# Patient Record
Sex: Male | Born: 1980 | Race: Black or African American | Hispanic: No | Marital: Single | Smoking: Never smoker
Health system: Southern US, Community
[De-identification: ages and names within clinical notes are randomized; demographics above are authoritative.]

## PROBLEM LIST (undated history)

## (undated) DIAGNOSIS — R310 Gross hematuria: Secondary | ICD-10-CM

## (undated) DIAGNOSIS — B20 Human immunodeficiency virus [HIV] disease: Secondary | ICD-10-CM

---

## 2013-12-30 ENCOUNTER — Other Ambulatory Visit: Payer: Self-pay | Admitting: Infectious Disease

## 2013-12-30 ENCOUNTER — Ambulatory Visit
Admission: RE | Admit: 2013-12-30 | Discharge: 2013-12-30 | Disposition: A | Discharge status: Home / Self Care | Payer: No Typology Code available for payment source | Source: Ambulatory Visit | Attending: Infectious Disease | Admitting: Infectious Disease

## 2013-12-30 DIAGNOSIS — A159 Respiratory tuberculosis unspecified: Secondary | ICD-10-CM

## 2014-01-11 ENCOUNTER — Encounter (HOSPITAL_COMMUNITY): Payer: Self-pay | Admitting: Emergency Medicine

## 2014-01-11 ENCOUNTER — Emergency Department (INDEPENDENT_AMBULATORY_CARE_PROVIDER_SITE_OTHER)
Admission: EM | Admit: 2014-01-11 | Discharge: 2014-01-11 | Disposition: A | Discharge status: Home / Self Care | Payer: Medicaid Other | Source: Home / Self Care | Attending: Family Medicine | Admitting: Family Medicine

## 2014-01-11 DIAGNOSIS — Z21 Asymptomatic human immunodeficiency virus [HIV] infection status: Secondary | ICD-10-CM

## 2014-01-11 DIAGNOSIS — R300 Dysuria: Secondary | ICD-10-CM

## 2014-01-11 DIAGNOSIS — R319 Hematuria, unspecified: Secondary | ICD-10-CM

## 2014-01-11 DIAGNOSIS — B20 Human immunodeficiency virus [HIV] disease: Secondary | ICD-10-CM

## 2014-01-11 HISTORY — DX: Human immunodeficiency virus (HIV) disease: B20

## 2014-01-11 LAB — CBC WITH DIFFERENTIAL/PLATELET
Basophils Absolute: 0 10*3/uL (ref 0.0–0.1)
Basophils Relative: 1 % (ref 0–1)
Eosinophils Absolute: 0.3 10*3/uL (ref 0.0–0.7)
Eosinophils Relative: 6 % — ABNORMAL HIGH (ref 0–5)
HCT: 42.8 % (ref 39.0–52.0)
Hemoglobin: 14.7 g/dL (ref 13.0–17.0)
LYMPHS ABS: 1.9 10*3/uL (ref 0.7–4.0)
LYMPHS PCT: 42 % (ref 12–46)
MCH: 30.5 pg (ref 26.0–34.0)
MCHC: 34.3 g/dL (ref 30.0–36.0)
MCV: 88.8 fL (ref 78.0–100.0)
Monocytes Absolute: 0.3 10*3/uL (ref 0.1–1.0)
Monocytes Relative: 7 % (ref 3–12)
NEUTROS ABS: 2 10*3/uL (ref 1.7–7.7)
NEUTROS PCT: 44 % (ref 43–77)
PLATELETS: 166 10*3/uL (ref 150–400)
RBC: 4.82 MIL/uL (ref 4.22–5.81)
RDW: 11.8 % (ref 11.5–15.5)
WBC: 4.5 10*3/uL (ref 4.0–10.5)

## 2014-01-11 LAB — URINALYSIS, ROUTINE W REFLEX MICROSCOPIC
BILIRUBIN URINE: NEGATIVE
GLUCOSE, UA: NEGATIVE mg/dL
KETONES UR: NEGATIVE mg/dL
Leukocytes, UA: NEGATIVE
Nitrite: NEGATIVE
PROTEIN: NEGATIVE mg/dL
Specific Gravity, Urine: 1.01 (ref 1.005–1.030)
Urobilinogen, UA: 0.2 mg/dL (ref 0.0–1.0)
pH: 7.5 (ref 5.0–8.0)

## 2014-01-11 LAB — URINE MICROSCOPIC-ADD ON

## 2014-01-11 LAB — POCT URINALYSIS DIP (DEVICE)
Bilirubin Urine: NEGATIVE
Glucose, UA: NEGATIVE mg/dL
KETONES UR: NEGATIVE mg/dL
Leukocytes, UA: NEGATIVE
Nitrite: NEGATIVE
PH: 7.5 (ref 5.0–8.0)
PROTEIN: NEGATIVE mg/dL
SPECIFIC GRAVITY, URINE: 1.015 (ref 1.005–1.030)
UROBILINOGEN UA: 0.2 mg/dL (ref 0.0–1.0)

## 2014-01-11 MED ORDER — PHENAZOPYRIDINE HCL 100 MG PO TABS: 100.0000 mg | ORAL_TABLET | Freq: Three times a day (TID) | ORAL | Status: DC | PRN

## 2014-01-11 NOTE — ED Notes (Signed)
Pt was diagnosed with HIV 1.5 years ago in [place].  Pt has not had treatment.  He has had blood in his urine for over a year.  He complains of urgency and frequency of urination.  They would like to get a referral to a Urologist.

## 2014-01-11 NOTE — Discharge Instructions (Signed)
The cause of your bleeding needs further workup. Urology will call you with an appointment Please keep your appointment with the ID doctor ion the 27th Please use the pyridium for the penile irritation Please continue to try to get an appointment at the [facility].     La cause de vos saignements besoin traitement ultrieur. Urologie vous appeler [employer] un rendez-vous S'il vous plat garder votre rendez-vous avec le mdecin de l'ID ions 27 S'il vous plat utiliser le pyridium pour l'irritation du pnis S'il vous plat continuer  essayer d'obtenir un rendez-vous au centre [facility].

## 2014-01-11 NOTE — ED Provider Notes (Signed)
CSN: [id number]     Arrival date & time 01/11/14  1347 History   First MD Initiated Contact with Patient 01/11/14 1538     Chief Complaint  Patient presents with  . Hematuria  . Urinary Frequency  . Urinary Urgency   (Consider location/radiation/quality/duration/timing/severity/associated sxs/prior Treatment) HPI  Blood in urine: present for more than 1 year. Typically at the end of urinary stream. Every time. Hurts 5/10 every time. No history of instrumentation. [place] in [place] urologist which was incomplete due to HIV status which pt states the doctor refused to treat him.   Migrated to [place] 4 wks ago.   HIV Dx 66mo ago. Unsure of how he contracted the illness. Told he could not get treatment in [place]. Denies IV drug use. Pt w/ appt set to be seen by ID clinic on December 27th.   Denies Fevers, CP, palpitations, abd pain.   TB screening is negative.    Past Medical History  Diagnosis Date  . HIV disease    History reviewed. No pertinent past surgical history. History reviewed. No pertinent family history. History  Substance Use Topics  . Smoking status: Never Smoker   . Smokeless tobacco: Never Used  . Alcohol Use: No    Review of Systems Per HPI with all other pertinent systems negative.   Allergies  Review of patient's allergies indicates no known allergies.  Home Medications   Prior to Admission medications   Medication Sig Start Date End Date Taking? Authorizing Provider  phenazopyridine (PYRIDIUM) 100 MG tablet Take 1-2 tablets (100-200 mg total) by mouth 3 (three) times daily as needed for pain. 01/11/14   Ozella Rocksavid J Tonya Wantz, MD   BP 117/73 mmHg  Pulse 74  Temp(Src) 98.3 F (36.8 C) (Oral)  Resp 14  SpO2 100% Physical Exam  Constitutional: He is oriented to person, place, and time. He appears well-developed and well-nourished. No distress.  HENT:  Head: Normocephalic and atraumatic.  Eyes: EOM are normal. Pupils are equal, round, and  reactive to light.  Neck: Normal range of motion.  Cardiovascular: Normal rate, normal heart sounds and intact distal pulses.   No murmur heard. Pulmonary/Chest: Effort normal and breath sounds normal. No respiratory distress.  Abdominal: Soft. Bowel sounds are normal. He exhibits no distension.  Musculoskeletal: He exhibits no edema or tenderness.  Neurological: He is alert and oriented to person, place, and time. No cranial nerve deficit. Coordination normal.  Skin: Skin is warm. He is not diaphoretic.  Psychiatric: He has a normal mood and affect. His behavior is normal. Judgment and thought content normal.    ED Course  Procedures (including critical care time) Labs Review Labs Reviewed  POCT URINALYSIS DIP (DEVICE) - Abnormal; Notable for the following:    Hgb urine dipstick MODERATE (*)    All other components within normal limits  URINE CULTURE  URINALYSIS, ROUTINE W REFLEX MICROSCOPIC    Imaging Review No results found.   MDM   1. HIV (human immunodeficiency virus infection)   2. Hematuria   3. Dysuria    [facility] contacted and will see pt Urine micro and cx sent Start pyridium for dysuria  HIV - appt set for 01/31/14 - CD4, CBC w/ diff, HIV quant    Ozella Rocksavid J Marvina Danner, MD 01/11/14 [phone]

## 2014-01-12 LAB — HIV 1/2 CONFIRMATION
HIV 1 ANTIBODY: POSITIVE — AB
HIV-2 Ab: NEGATIVE

## 2014-01-12 LAB — URINE CULTURE
CULTURE: NO GROWTH
Colony Count: NO GROWTH

## 2014-01-12 LAB — T-HELPER CELLS (CD4) COUNT (NOT AT ARMC)
CD4 T CELL ABS: 470 /uL (ref 400–2700)
CD4 T CELL HELPER: 25 % — AB (ref 33–55)

## 2014-01-12 LAB — HIV ANTIBODY (ROUTINE TESTING W REFLEX): HIV: REACTIVE — AB

## 2014-01-13 ENCOUNTER — Telehealth: Payer: Self-pay | Admitting: Internal Medicine

## 2014-01-13 NOTE — Telephone Encounter (Signed)
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 Gave address. Will arrange for french translator in clinic at 2pm.

## 2014-01-14 NOTE — ED Notes (Signed)
Discussed pos. results with Dr. Konrad DoloresMerrell.  He said labs were drawn for pt. to f/u with ID.  No further action needed. Vassie MoselleYork, Lorn Butcher M 01/14/2014

## 2014-01-18 ENCOUNTER — Telehealth: Payer: Self-pay

## 2014-01-18 NOTE — Telephone Encounter (Signed)
Patient has scheduled appointment for intake which [facility]   Laurell Josephsammy K Tessia Kassin, [place]

## 2014-01-18 NOTE — Telephone Encounter (Signed)
-----   Message from Judyann Munsonynthia Snider, MD sent at 01/13/2014 11:11 AM EST ----- Contact: [phone] Hi [name], This is one of ED HIV+ patients, but he is known HIV+ recently relocated from [place], french speaking. ART naive. His name is Sean Dodson (with a soft g)  His phone number is not listed correctly in epic. It is [phone]. He is planning to coming to clinic thurs afternoon at 2pm.  Can we have a french translator come tomorrow afternoon? I know it is last minute noticed. I am not sure if he understood everything I was saying in french..then again my french over the phone is not great. I think he understood that I knew he had HIV and that we could help him with getting on treatment.

## 2014-01-26 ENCOUNTER — Ambulatory Visit: Payer: Self-pay

## 2014-01-27 ENCOUNTER — Other Ambulatory Visit (HOSPITAL_COMMUNITY)
Admission: RE | Admit: 2014-01-27 | Discharge: 2014-01-27 | Disposition: A | Discharge status: Home / Self Care | Payer: Medicaid Other | Source: Ambulatory Visit | Attending: Infectious Diseases | Admitting: Infectious Diseases

## 2014-01-27 ENCOUNTER — Ambulatory Visit (INDEPENDENT_AMBULATORY_CARE_PROVIDER_SITE_OTHER): Payer: Medicaid Other

## 2014-01-27 DIAGNOSIS — Z113 Encounter for screening for infections with a predominantly sexual mode of transmission: Secondary | ICD-10-CM | POA: Diagnosis present

## 2014-01-27 DIAGNOSIS — Z21 Asymptomatic human immunodeficiency virus [HIV] infection status: Secondary | ICD-10-CM

## 2014-01-27 DIAGNOSIS — Z23 Encounter for immunization: Secondary | ICD-10-CM

## 2014-01-27 NOTE — Progress Notes (Signed)
Patient is from [place] and traveled to [place] where he lived for 9 years and became infected with IV in 2013.  [place] the [place] since 12[place]02[place]13 . [facility].  He was tested to confirm positivity on 12-24-2013. He has never received treatment for HIV. No tattoos or piercings . No medical records to request. Pneumonia and flu vaccines given at visit.   Laurell Josephsammy K Bethanny Toelle, RN

## 2014-01-28 LAB — COMPLETE METABOLIC PANEL WITH GFR
ALT: 17 U/L (ref 0–53)
AST: 21 U/L (ref 0–37)
Albumin: 3.9 g/dL (ref 3.5–5.2)
Alkaline Phosphatase: 54 U/L (ref 39–117)
BUN: 11 mg/dL (ref 6–23)
CALCIUM: 9.2 mg/dL (ref 8.4–10.5)
CHLORIDE: 101 meq/L (ref 96–112)
CO2: 31 meq/L (ref 19–32)
CREATININE: 0.72 mg/dL (ref 0.50–1.35)
Glucose, Bld: 82 mg/dL (ref 70–99)
POTASSIUM: 4.2 meq/L (ref 3.5–5.3)
Sodium: 138 mEq/L (ref 135–145)
Total Bilirubin: 0.5 mg/dL (ref 0.2–1.2)
Total Protein: 7.6 g/dL (ref 6.0–8.3)

## 2014-01-28 LAB — HEPATITIS B CORE ANTIBODY, TOTAL: Hep B Core Total Ab: NONREACTIVE

## 2014-01-28 LAB — CBC WITH DIFFERENTIAL/PLATELET
BASOS ABS: 0 10*3/uL (ref 0.0–0.1)
Basophils Relative: 1 % (ref 0–1)
Eosinophils Absolute: 0.4 10*3/uL (ref 0.0–0.7)
Eosinophils Relative: 10 % — ABNORMAL HIGH (ref 0–5)
HEMATOCRIT: 42.3 % (ref 39.0–52.0)
HEMOGLOBIN: 14.7 g/dL (ref 13.0–17.0)
LYMPHS PCT: 27 % (ref 12–46)
Lymphs Abs: 1.1 10*3/uL (ref 0.7–4.0)
MCH: 31.1 pg (ref 26.0–34.0)
MCHC: 34.8 g/dL (ref 30.0–36.0)
MCV: 89.4 fL (ref 78.0–100.0)
MPV: 11.4 fL (ref 9.4–12.4)
Monocytes Absolute: 0.3 10*3/uL (ref 0.1–1.0)
Monocytes Relative: 7 % (ref 3–12)
Neutro Abs: 2.1 10*3/uL (ref 1.7–7.7)
Neutrophils Relative %: 55 % (ref 43–77)
Platelets: 162 10*3/uL (ref 150–400)
RBC: 4.73 MIL/uL (ref 4.22–5.81)
RDW: 12.2 % (ref 11.5–15.5)
WBC: 3.9 10*3/uL — AB (ref 4.0–10.5)

## 2014-01-28 LAB — URINALYSIS
Bilirubin Urine: NEGATIVE
GLUCOSE, UA: NEGATIVE mg/dL
HGB URINE DIPSTICK: NEGATIVE
KETONES UR: NEGATIVE mg/dL
Leukocytes, UA: NEGATIVE
Nitrite: NEGATIVE
PH: 6.5 (ref 5.0–8.0)
Protein, ur: NEGATIVE mg/dL
Specific Gravity, Urine: 1.017 (ref 1.005–1.030)
Urobilinogen, UA: 1 mg/dL (ref 0.0–1.0)

## 2014-01-28 LAB — T-HELPER CELL (CD4) - (RCID CLINIC ONLY)
CD4 % Helper T Cell: 25 % — ABNORMAL LOW (ref 33–55)
CD4 T Cell Abs: 230 /uL — ABNORMAL LOW (ref 400–2700)

## 2014-01-28 LAB — HEPATITIS B SURFACE ANTIGEN: HEP B S AG: NEGATIVE

## 2014-01-28 LAB — LIPID PANEL
CHOL/HDL RATIO: 3.9 ratio
Cholesterol: 147 mg/dL (ref 0–200)
HDL: 38 mg/dL — ABNORMAL LOW (ref >39)
LDL Cholesterol: 90 mg/dL (ref 0–99)
TRIGLYCERIDES: 97 mg/dL (ref <150)
VLDL: 19 mg/dL (ref 0–40)

## 2014-01-28 LAB — RPR

## 2014-01-28 LAB — HEPATITIS A ANTIBODY, TOTAL: Hep A Total Ab: REACTIVE — AB

## 2014-01-28 LAB — URINE CYTOLOGY ANCILLARY ONLY
Chlamydia: NEGATIVE
NEISSERIA GONORRHEA: NEGATIVE

## 2014-01-28 LAB — HEPATITIS C ANTIBODY: HCV AB: NEGATIVE

## 2014-01-28 LAB — HEPATITIS B SURFACE ANTIBODY,QUALITATIVE: HEP B S AB: POSITIVE — AB

## 2014-01-30 LAB — QUANTIFERON TB GOLD ASSAY (BLOOD)
INTERFERON GAMMA RELEASE ASSAY: NEGATIVE
Mitogen value: 2.72 IU/mL
Quantiferon Nil Value: 0.06 IU/mL
Quantiferon Tb Ag Minus Nil Value: 0.02 IU/mL
TB AG VALUE: 0.08 [IU]/mL

## 2014-01-30 LAB — HIV-1 RNA ULTRAQUANT REFLEX TO GENTYP+
HIV 1 RNA QUANT: 11709 {copies}/mL — AB (ref <20)
HIV-1 RNA Quant, Log: 4.07 {Log} — ABNORMAL HIGH (ref <1.30)

## 2014-02-03 LAB — HLA B*5701: HLA-B 5701 W/RFLX HLA-B HIGH: NEGATIVE

## 2014-02-15 LAB — HIV-1 GENOTYPR PLUS

## 2014-02-18 ENCOUNTER — Ambulatory Visit: Payer: Medicaid Other | Admitting: Infectious Diseases

## 2014-02-23 ENCOUNTER — Ambulatory Visit (INDEPENDENT_AMBULATORY_CARE_PROVIDER_SITE_OTHER): Payer: Medicaid Other | Admitting: Infectious Diseases

## 2014-02-23 ENCOUNTER — Other Ambulatory Visit: Payer: Self-pay | Admitting: *Deleted

## 2014-02-23 ENCOUNTER — Encounter: Payer: Self-pay | Admitting: Infectious Diseases

## 2014-02-23 VITALS — BP 113/70 | HR 64 | Temp 97.7°F | Wt 146.0 lb

## 2014-02-23 DIAGNOSIS — B20 Human immunodeficiency virus [HIV] disease: Secondary | ICD-10-CM

## 2014-02-23 MED ORDER — ELVITEG-COBIC-EMTRICIT-TENOFAF 150-150-200-10 MG PO TABS: 1.0000 | ORAL_TABLET | Freq: Every day | ORAL | Status: DC

## 2014-02-23 NOTE — Assessment & Plan Note (Signed)
With interpreter, explained his CD4, VL and other blood tests. Will check on his ADAP (chart lists he has medicaid). Will start him on genvoya. Explained importance of adherence to him via interpreter. Will see him back in 4-6 weeks.  Has gotten flu and pneumovax. Is hep B and A immune.

## 2014-02-23 NOTE — Progress Notes (Signed)
   Subjective:    Patient ID: Sean AgeeSerge Dodson, male    DOB: 02/04/1981, 34 y.o.   MRN: [id number]  HPI 34 yo M with newly dx HIV+. Was born in [place], came [place] in end of [place] He was tested prior but believes these were negative. Had this test  done as a screen. Has had no illnesses.   HIV 1 RNA QUANT (copies/mL)  Date Value  01/27/2014 11709*   CD4 T CELL ABS (/uL)  Date Value  01/27/2014 230*  01/11/2014 470   Denies fhx of illnesses.  No drugs, ETOH, smoking.   Review of Systems  Constitutional: Negative for appetite change and unexpected weight change.  Eyes: Negative for visual disturbance.  Respiratory: Negative for cough and shortness of breath.   Gastrointestinal: Negative for diarrhea and constipation.  Genitourinary: Negative for difficulty urinating.  Neurological: Positive for headaches.  Hematological: Negative for adenopathy.       Objective:   Physical Exam  Constitutional: He appears well-developed and well-nourished.  HENT:  Mouth/Throat: No oropharyngeal exudate.  Eyes: EOM are normal. Pupils are equal, round, and reactive to light.  Neck: Neck supple.  Cardiovascular: Normal rate, regular rhythm and normal heart sounds.   Pulmonary/Chest: Effort normal and breath sounds normal.  Abdominal: Soft. Bowel sounds are normal. There is no rebound and no guarding.  Lymphadenopathy:    He has no cervical adenopathy.          Assessment & Plan:

## 2014-02-25 NOTE — Progress Notes (Signed)
Interpreter Boukari Saidou facilitated this appointment. Andree CossHowell, Leopold Smyers M, RN

## 2014-03-22 ENCOUNTER — Ambulatory Visit: Payer: Medicaid Other | Admitting: Infectious Diseases

## 2014-05-03 ENCOUNTER — Other Ambulatory Visit: Payer: Self-pay | Admitting: Urology

## 2014-05-31 ENCOUNTER — Encounter (HOSPITAL_BASED_OUTPATIENT_CLINIC_OR_DEPARTMENT_OTHER): Payer: Self-pay | Admitting: *Deleted

## 2014-06-01 ENCOUNTER — Encounter (HOSPITAL_BASED_OUTPATIENT_CLINIC_OR_DEPARTMENT_OTHER): Payer: Self-pay | Admitting: *Deleted

## 2014-06-01 NOTE — Progress Notes (Signed)
Spoke with Nsona Kayanda-medical sponsor/interpretor-instructed Npo after Mn-will arrive at 1115-Hg on arrival.

## 2014-06-02 ENCOUNTER — Ambulatory Visit (HOSPITAL_BASED_OUTPATIENT_CLINIC_OR_DEPARTMENT_OTHER): Payer: Medicaid Other | Admitting: Anesthesiology

## 2014-06-02 ENCOUNTER — Encounter (HOSPITAL_BASED_OUTPATIENT_CLINIC_OR_DEPARTMENT_OTHER): Payer: Self-pay | Admitting: *Deleted

## 2014-06-02 ENCOUNTER — Ambulatory Visit (HOSPITAL_BASED_OUTPATIENT_CLINIC_OR_DEPARTMENT_OTHER)
Admission: RE | Admit: 2014-06-02 | Discharge: 2014-06-02 | Disposition: A | Discharge status: Home / Self Care | Payer: Medicaid Other | Source: Ambulatory Visit | Attending: Urology | Admitting: Urology

## 2014-06-02 ENCOUNTER — Encounter (HOSPITAL_BASED_OUTPATIENT_CLINIC_OR_DEPARTMENT_OTHER)
Admission: RE | Disposition: A | Discharge status: Home / Self Care | Payer: Self-pay | Source: Ambulatory Visit | Attending: Urology

## 2014-06-02 DIAGNOSIS — B650 Schistosomiasis due to Schistosoma haematobium [urinary schistosomiasis]: Secondary | ICD-10-CM | POA: Insufficient documentation

## 2014-06-02 DIAGNOSIS — R310 Gross hematuria: Secondary | ICD-10-CM | POA: Diagnosis present

## 2014-06-02 DIAGNOSIS — Z21 Asymptomatic human immunodeficiency virus [HIV] infection status: Secondary | ICD-10-CM | POA: Diagnosis not present

## 2014-06-02 DIAGNOSIS — N33 Bladder disorders in diseases classified elsewhere: Secondary | ICD-10-CM | POA: Insufficient documentation

## 2014-06-02 HISTORY — PX: CYSTOSCOPY W/ URETERAL STENT PLACEMENT: SHX1429

## 2014-06-02 HISTORY — DX: Gross hematuria: R31.0

## 2014-06-02 LAB — POCT I-STAT, CHEM 8
BUN: 13 mg/dL (ref 6–23)
CREATININE: 0.8 mg/dL (ref 0.50–1.35)
Calcium, Ion: 1.3 mmol/L — ABNORMAL HIGH (ref 1.12–1.23)
Chloride: 102 mmol/L (ref 96–112)
Glucose, Bld: 90 mg/dL (ref 70–99)
HCT: 40 % (ref 39.0–52.0)
Hemoglobin: 13.6 g/dL (ref 13.0–17.0)
Potassium: 3.8 mmol/L (ref 3.5–5.1)
Sodium: 139 mmol/L (ref 135–145)
TCO2: 23 mmol/L (ref 0–100)

## 2014-06-02 SURGERY — CYSTOSCOPY, WITH RETROGRADE PYELOGRAM AND URETERAL STENT INSERTION
Anesthesia: General | Site: Ureter | Laterality: Bilateral

## 2014-06-02 MED ORDER — ACETAMINOPHEN 10 MG/ML IV SOLN
INTRAVENOUS | Status: DC | PRN
  Administered 2014-06-02: 1000 mg via INTRAVENOUS

## 2014-06-02 MED ORDER — SODIUM CHLORIDE 0.9 % IR SOLN
Status: DC | PRN
  Administered 2014-06-02: 3000 mL via INTRAVESICAL

## 2014-06-02 MED ORDER — PROPOFOL 10 MG/ML IV BOLUS
INTRAVENOUS | Status: DC | PRN
  Administered 2014-06-02: 180 mg via INTRAVENOUS

## 2014-06-02 MED ORDER — OXYCODONE HCL 5 MG PO TABS
5.0000 mg | ORAL_TABLET | Freq: Once | ORAL | Status: DC | PRN
  Filled 2014-06-02: qty 1

## 2014-06-02 MED ORDER — ONDANSETRON HCL 4 MG/2ML IJ SOLN
4.0000 mg | Freq: Four times a day (QID) | INTRAMUSCULAR | Status: DC | PRN
  Filled 2014-06-02: qty 2

## 2014-06-02 MED ORDER — MIDAZOLAM HCL 5 MG/5ML IJ SOLN
INTRAMUSCULAR | Status: DC | PRN
  Administered 2014-06-02: 2 mg via INTRAVENOUS

## 2014-06-02 MED ORDER — MIDAZOLAM HCL 2 MG/2ML IJ SOLN
INTRAMUSCULAR | Status: AC
  Filled 2014-06-02: qty 2

## 2014-06-02 MED ORDER — LACTATED RINGERS IV SOLN
INTRAVENOUS | Status: DC
  Administered 2014-06-02 (×2): via INTRAVENOUS
  Filled 2014-06-02: qty 1000

## 2014-06-02 MED ORDER — CIPROFLOXACIN IN D5W 400 MG/200ML IV SOLN
INTRAVENOUS | Status: AC
  Filled 2014-06-02: qty 200

## 2014-06-02 MED ORDER — BELLADONNA ALKALOIDS-OPIUM 16.2-60 MG RE SUPP
RECTAL | Status: AC
  Filled 2014-06-02: qty 1

## 2014-06-02 MED ORDER — KETOROLAC TROMETHAMINE 30 MG/ML IJ SOLN
INTRAMUSCULAR | Status: DC | PRN
  Administered 2014-06-02: 30 mg via INTRAVENOUS

## 2014-06-02 MED ORDER — OXYCODONE HCL 5 MG/5ML PO SOLN
5.0000 mg | Freq: Once | ORAL | Status: DC | PRN
  Filled 2014-06-02: qty 5

## 2014-06-02 MED ORDER — ACETAMINOPHEN-CODEINE #3 300-30 MG PO TABS: 1.0000 | ORAL_TABLET | ORAL | Status: DC | PRN

## 2014-06-02 MED ORDER — CIPROFLOXACIN IN D5W 400 MG/200ML IV SOLN
400.0000 mg | INTRAVENOUS | Status: AC
  Administered 2014-06-02: 400 mg via INTRAVENOUS
  Filled 2014-06-02: qty 200

## 2014-06-02 MED ORDER — FENTANYL CITRATE (PF) 100 MCG/2ML IJ SOLN
25.0000 ug | INTRAMUSCULAR | Status: DC | PRN
Start: 2014-06-02 — End: 2014-06-02
  Filled 2014-06-02: qty 1

## 2014-06-02 MED ORDER — PHENAZOPYRIDINE HCL 100 MG PO TABS: 200.0000 mg | ORAL_TABLET | Freq: Three times a day (TID) | ORAL | Status: DC | PRN

## 2014-06-02 MED ORDER — FENTANYL CITRATE (PF) 100 MCG/2ML IJ SOLN
INTRAMUSCULAR | Status: DC | PRN
  Administered 2014-06-02: 50 ug via INTRAVENOUS

## 2014-06-02 MED ORDER — LIDOCAINE HCL (CARDIAC) 20 MG/ML IV SOLN
INTRAVENOUS | Status: DC | PRN
  Administered 2014-06-02: 50 mg via INTRAVENOUS

## 2014-06-02 MED ORDER — DEXAMETHASONE SODIUM PHOSPHATE 4 MG/ML IJ SOLN
INTRAMUSCULAR | Status: DC | PRN
  Administered 2014-06-02: 10 mg via INTRAVENOUS

## 2014-06-02 MED ORDER — STERILE WATER FOR IRRIGATION IR SOLN
Status: DC | PRN
Start: 2014-06-02 — End: 2014-06-02
  Administered 2014-06-02 (×2): 3000 mL

## 2014-06-02 MED ORDER — EPHEDRINE SULFATE 50 MG/ML IJ SOLN
INTRAMUSCULAR | Status: DC | PRN
  Administered 2014-06-02 (×3): 10 mg via INTRAVENOUS

## 2014-06-02 MED ORDER — ONDANSETRON HCL 4 MG/2ML IJ SOLN
INTRAMUSCULAR | Status: DC | PRN
  Administered 2014-06-02: 4 mg via INTRAVENOUS

## 2014-06-02 MED ORDER — FENTANYL CITRATE (PF) 100 MCG/2ML IJ SOLN
INTRAMUSCULAR | Status: AC
  Filled 2014-06-02: qty 4

## 2014-06-02 SURGICAL SUPPLY — 38 items
ADAPTER CATH URET PLST 4-6FR (CATHETERS) IMPLANT
BAG DRAIN URO-CYSTO SKYTR STRL (DRAIN) ×3 IMPLANT
BASKET LASER NITINOL 1.9FR (BASKET) IMPLANT
BASKET STNLS GEMINI 4WIRE 3FR (BASKET) IMPLANT
BASKET ZERO TIP NITINOL 2.4FR (BASKET) IMPLANT
CANISTER SUCT LVC 12 LTR MEDI- (MISCELLANEOUS) ×3 IMPLANT
CATH FOLEY 2WAY SLVR  5CC 24FR (CATHETERS) ×2
CATH FOLEY 2WAY SLVR 5CC 24FR (CATHETERS) ×1 IMPLANT
CATH INTERMIT  6FR 70CM (CATHETERS) IMPLANT
CATH URET 5FR 28IN CONE TIP (BALLOONS)
CATH URET 5FR 28IN OPEN ENDED (CATHETERS) ×3 IMPLANT
CATH URET 5FR 70CM CONE TIP (BALLOONS) IMPLANT
CATH URET DUAL LUMEN 6-10FR 50 (CATHETERS) IMPLANT
CLOTH BEACON ORANGE TIMEOUT ST (SAFETY) ×3 IMPLANT
DRSG TEGADERM 2-3/8X2-3/4 SM (GAUZE/BANDAGES/DRESSINGS) IMPLANT
FIBER LASER FLEXIVA 200 (UROLOGICAL SUPPLIES) IMPLANT
FIBER LASER FLEXIVA 365 (UROLOGICAL SUPPLIES) IMPLANT
GLOVE BIO SURGEON STRL SZ 6.5 (GLOVE) ×2 IMPLANT
GLOVE BIO SURGEON STRL SZ7.5 (GLOVE) ×3 IMPLANT
GLOVE BIO SURGEONS STRL SZ 6.5 (GLOVE) ×1
GLOVE INDICATOR 6.5 STRL GRN (GLOVE) ×6 IMPLANT
GOWN STRL REUS W/ TWL LRG LVL3 (GOWN DISPOSABLE) ×1 IMPLANT
GOWN STRL REUS W/ TWL XL LVL3 (GOWN DISPOSABLE) ×1 IMPLANT
GOWN STRL REUS W/TWL LRG LVL3 (GOWN DISPOSABLE) ×2
GOWN STRL REUS W/TWL XL LVL3 (GOWN DISPOSABLE) ×2
GUIDEWIRE 0.038 PTFE COATED (WIRE) IMPLANT
GUIDEWIRE ANG ZIPWIRE 038X150 (WIRE) IMPLANT
GUIDEWIRE STR DUAL SENSOR (WIRE) ×3 IMPLANT
IV NS IRRIG 3000ML ARTHROMATIC (IV SOLUTION) ×6 IMPLANT
KIT BALLIN UROMAX 15FX10 (LABEL) IMPLANT
KIT BALLN UROMAX 15FX4 (MISCELLANEOUS) IMPLANT
KIT BALLN UROMAX 26 75X4 (MISCELLANEOUS)
NS IRRIG 500ML POUR BTL (IV SOLUTION) IMPLANT
PACK CYSTO (CUSTOM PROCEDURE TRAY) ×3 IMPLANT
SET HIGH PRES BAL DIL (LABEL)
SHEATH ACCESS URETERAL 38CM (SHEATH) IMPLANT
TUBE FEEDING 8FR 16IN STR KANG (MISCELLANEOUS) IMPLANT
WATER STERILE IRR 3000ML UROMA (IV SOLUTION) ×6 IMPLANT

## 2014-06-02 NOTE — Progress Notes (Signed)
Pt and friend aware that Anesthesia strongly recommends that he have a responsible adult in the house w/ him tonight. Pt says he lives alone, refuses to stay in the hospital, just wants to go home and sleep. Pt and friend stated they are aware that his going home alone without someone to stay w/ him is against medical advice.  Pt refuses to stay in house or with anyone else.

## 2014-06-02 NOTE — H&P (Signed)
Reason For Visit gross hematuria   History of Present Illness 38M with history of HIV and gross hematuria. Immigrated from [place] recently. Hematuria workup revealed suspicious areas in his bladder - description by Dr Sherron MondayMacDiarmid less like TCC, but possibly SCC or nephrogenic adenoma. Dr. Sherron MondayMacDiarmid recommended that the patient undergo biospy. He presents today as pre-op consult prior to biopsy. The patient states that he has had ongoing gross hematuria. He denies any dysuria. He denies any fevers or chills. His appetite is good, his weight is stable. He denies night sweats or lethargy. He currently is working as a Copyjanitor.   Past Medical History Problems  1. History of human immunodeficiency virus infection (Z86.19)  Surgical History Problems  1. History of No Surgical Problems  Current Meds 1. No Reported Medications Recorded  Allergies Medication  1. No Known Drug Allergies  Family History Problems  1. Family history of No significant past medical history  Social History Problems  1. Denied: History of Alcohol use 2. Daily caffeine consumption, 1 serving a day 3. Never a smoker 4. Single 5. Student  Vitals Vital Signs [Data Includes: Last 1 Day]  Recorded: 22Mar2016 01:03PM  Blood Pressure: 123 / 68 Heart Rate: 59  Physical Exam Constitutional: Well nourished and well developed . No acute distress.  Neck: The appearance of the neck is normal and no neck mass is present.  Pulmonary: No respiratory distress and normal respiratory rhythm and effort.  Cardiovascular: Heart rate and rhythm are normal . No peripheral edema.  Abdomen: The abdomen is soft and nontender. No masses are palpated. No CVA tenderness. No hernias are palpable. No hepatosplenomegaly noted.  Skin: Normal skin turgor, no visible rash and no visible skin lesions.  Neuro/Psych:. Mood and affect are appropriate.    Results/Data Urine [Data Includes: Last 1 Day]   22Mar2016  COLOR YELLOW   APPEARANCE  CLOUDY   SPECIFIC GRAVITY 1.025   pH 5.5   GLUCOSE NEG mg/dL  BILIRUBIN NEG   KETONE NEG mg/dL  BLOOD LARGE   PROTEIN NEG mg/dL  UROBILINOGEN 0.2 mg/dL  NITRITE NEG   LEUKOCYTE ESTERASE NEG   SQUAMOUS EPITHELIAL/HPF RARE   WBC 3-6 WBC/hpf  RBC TNTC RBC/hpf  BACTERIA MANY   CRYSTALS NONE SEEN   CASTS NONE SEEN    Patient's urine demonstrates hematuria without evidence of infection. A urine culture and urine cytology have been sent.   Assessment The patient has ongoing gross hematuria with a suspicious area within the bladder. S   Plan Gross hematuria  1. Follow-up Office  Follow-up  Status: Hold For - Date of Service  Requested for:  22Mar2016 2. Follow-up Schedule Surgery Office  Follow-up  Status: Hold For - Appointment   Requested for: 22Mar2016 3. URINE CULTURE; Status:In Progress - Specimen/Data Collected;   Done: 22Mar2016 4. URINE CYTOLOGY; Status:In Progress - Specimen/Data Collected;   Done: 22Mar2016 Health Maintenance  5. UA With REFLEX; [Do Not Release]; Status:Complete;   Done: 22Mar2016 12:39PM  Discussion/Summary I recommended that we proceed to the operating room for a bladder biopsy and fulguration as well as bilateral retrograde pyelograms. I went over the risks and benefits of this procedure with the patient in detail. Having heard all the information, the patient would like to proceed. We'll get this done as soon as convenient for the patient.

## 2014-06-02 NOTE — Discharge Instructions (Signed)
Transurethral Resection of Bladder Tumor (TURBT) or Bladder Biopsy ° ° °Definition: ° Transurethral Resection of the Bladder Tumor is a surgical procedure used to diagnose and remove tumors within the bladder. TURBT is the most common treatment for early stage bladder cancer. ° °General instructions: °   ° Your recent bladder surgery requires very little post hospital care but some definite precautions. ° °Despite the fact that no skin incisions were used, the area around the bladder incisions are raw and covered with scabs to promote healing and prevent bleeding. Certain precautions are needed to insure that the scabs are not disturbed over the next 2-4 weeks while the healing proceeds. ° °Because the raw surface inside your bladder and the irritating effects of urine you may expect frequency of urination and/or urgency (a stronger desire to urinate) and perhaps even getting up at night more often. This will usually resolve or improve slowly over the healing period. You may see some blood in your urine over the first 6 weeks. Do not be alarmed, even if the urine was clear for a while. Get off your feet and drink lots of fluids until clearing occurs. If you start to pass clots or don't improve call us. ° °Diet: ° °You may return to your normal diet immediately. Because of the raw surface of your bladder, alcohol, spicy foods, foods high in acid and drinks with caffeine may cause irritation or frequency and should be used in moderation. To keep your urine flowing freely and avoid constipation, drink plenty of fluids during the day (8-10 glasses). Tip: Avoid cranberry juice because it is very acidic. ° °Activity: ° °Your physical activity doesn't need to be restricted. However, if you are very active, you may see some blood in the urine. We suggest that you reduce your activity under the circumstances until the bleeding has stopped. ° °Bowels: ° °It is important to keep your bowels regular during the postoperative  period. Straining with bowel movements can cause bleeding. A bowel movement every other day is reasonable. Use a mild laxative if needed, such as milk of magnesia 2-3 tablespoons, or 2 Dulcolax tablets. Call if you continue to have problems. If you had been taking narcotics for pain, before, during or after your surgery, you may be constipated. Take a laxative if necessary. ° ° ° °Medication: ° °You should resume your pre-surgery medications unless told not to. In addition you may be given an antibiotic to prevent or treat infection. Antibiotics are not always necessary. All medication should be taken as prescribed until the bottles are finished unless you are having an unusual reaction to one of the drugs. ° ° ° ° ° °Post Anesthesia Home Care Instructions ° °Activity: °Get plenty of rest for the remainder of the day. A responsible adult should stay with you for 24 hours following the procedure.  °For the next 24 hours, DO NOT: °-Drive a car °-Operate machinery °-Drink alcoholic beverages °-Take any medication unless instructed by your physician °-Make any legal decisions or sign important papers. ° °Meals: °Start with liquid foods such as gelatin or soup. Progress to regular foods as tolerated. Avoid greasy, spicy, heavy foods. If nausea and/or vomiting occur, drink only clear liquids until the nausea and/or vomiting subsides. Call your physician if vomiting continues. ° °Special Instructions/Symptoms: °Your throat may feel dry or sore from the anesthesia or the breathing tube placed in your throat during surgery. If this causes discomfort, gargle with warm salt water. The discomfort should disappear within 24   hours. ° °If you had a scopolamine patch placed behind your ear for the management of post- operative nausea and/or vomiting: ° °1. The medication in the patch is effective for 72 hours, after which it should be removed.  Wrap patch in a tissue and discard in the trash. Wash hands thoroughly with soap and  water. °2. You may remove the patch earlier than 72 hours if you experience unpleasant side effects which may include dry mouth, dizziness or visual disturbances. °3. Avoid touching the patch. Wash your hands with soap and water after contact with the patch. °  ° °

## 2014-06-02 NOTE — Transfer of Care (Signed)
Last Vitals:  Filed Vitals:   06/02/14 1144  BP: 109/55  Pulse: 53  Temp: 36.6 C  Resp: 12   Immediate Anesthesia Transfer of Care Note  Patient: Sean Dodson  Procedure(s) Performed: Procedure(s) (LRB): CYSTOSCOPY/BLADDER BIOPSY WITH BILATERAL RETROGRADE PYELOGRAM (Bilateral)  Patient Location: PACU  Anesthesia Type: General  Level of Consciousness: awake, alert  and oriented  Airway & Oxygen Therapy: Patient Spontanous Breathing and Patient connected to face mask oxygen  Post-op Assessment: Report given to PACU RN and Post -op Vital signs reviewed and stable  Post vital signs: Reviewed and stable  Complications: No apparent anesthesia complications

## 2014-06-02 NOTE — Anesthesia Preprocedure Evaluation (Signed)
Anesthesia Evaluation  Patient identified by MRN, date of birth, ID band Patient awake    Reviewed: Allergy & Precautions, NPO status , Patient's Chart, lab work & pertinent test results  Airway Mallampati: II   Neck ROM: full    Dental   Pulmonary neg pulmonary ROS,  breath sounds clear to auscultation        Cardiovascular negative cardio ROS  Rhythm:regular Rate:Normal     Neuro/Psych    GI/Hepatic   Endo/Other    Renal/GU      Musculoskeletal   Abdominal   Peds  Hematology  (+) HIV,   Anesthesia Other Findings   Reproductive/Obstetrics                             Anesthesia Physical Anesthesia Plan  ASA: II  Anesthesia Plan: General   Post-op Pain Management:    Induction: Intravenous  Airway Management Planned: LMA  Additional Equipment:   Intra-op Plan:   Post-operative Plan:   Informed Consent: I have reviewed the patients History and Physical, chart, labs and discussed the procedure including the risks, benefits and alternatives for the proposed anesthesia with the patient or authorized representative who has indicated his/her understanding and acceptance.     Plan Discussed with: CRNA, Anesthesiologist and Surgeon  Anesthesia Plan Comments:         Anesthesia Quick Evaluation

## 2014-06-02 NOTE — Op Note (Signed)
Preoperative diagnosis:  1. Gross hematuria   Postoperative diagnosis:  1. same   Procedure: 1. Cystoscopy, Bladder biopsy 2. Bilateral retrograde pyelograms with interpretation  Surgeon: Crist FatBenjamin W. Kaylise Blakeley, MD  Anesthesia: General  Complications: None  Intraoperative findings: Approximately 8 mL of Omnipaque contrast was injected into the patient's ureters bilaterally. The ureters were normal caliber without significant filling defects within the renal pelvis. There was no apparent abnormality within the upper tract collecting system. The patient had 3 similar appearing lesions which are approximately 5 mm in size each. They appeared as hardened vascular clumps with a flaky mucosa surrounding the area of increased vascularity. Each one of these was biopsied separately sent to pathology separately. I also biopsied the bladder dome which did not have increased vascularity but did have a flaking coating over the mucosa  EBL: Minimal  Specimens: There were 3 separate bladder lesions: Right posterior wall, posterior bladder wall, left posterior wall. A random biopsy was taken at the anterior dome.  Indication: Janeece AgeeSerge Cajuste is a 34 y.o. patient with gross hematuria. Cystoscopy was in the clinic revealed unusual appearing areas within the bladder.  After reviewing the management options for treatment, he elected to proceed with the above surgical procedure(s). We have discussed the potential benefits and risks of the procedure, side effects of the proposed treatment, the likelihood of the patient achieving the goals of the procedure, and any potential problems that might occur during the procedure or recuperation. Informed consent has been obtained.  Description of procedure:  The patient was taken to the operating room and general anesthesia was induced.  The patient was placed in the dorsal lithotomy position, prepped and draped in the usual sterile fashion, and preoperative antibiotics were  administered. A preoperative time-out was performed.   21 French 30 cystoscope was then gently passed to the patient's urethra and in the bladder. A 70 cystoscope was then exchanged for the 30 lens and a 360 cystoscopic evaluation was performed. The ureters were orthotopic. There were 3 areas of mentioned in the findings. The remaining mucosa was fairly regular appearing. The 70 lens was then exchanged for the 30 lens and retrograde pyelograms were performed bilaterally with the above finding.  I exchanged to a 30 lens for a 0 lens and using the Tolbert biopsy forceps removed the suspicious areas as described above. I wish her to fulgurate the areas around the lesion. I then took one random biopsy the dome and fulgurated the area. There was notable bleeding at the bladder neck upon completion. As such, I placed an 4418 French Foley catheter. A B and O suppository was placed in the patient's rectum after an exam under anesthesia which revealed no palpable masses and a freely mobile bladder. The patient was subsequently returned to the PACU in stable condition.  Crist FatBenjamin W. Azan Maneri, M.D.

## 2014-06-02 NOTE — Anesthesia Postprocedure Evaluation (Signed)
  Anesthesia Post-op Note  Patient: Sean Dodson  Procedure(s) Performed: Procedure(s) (LRB): CYSTOSCOPY/BLADDER BIOPSY WITH BILATERAL RETROGRADE PYELOGRAM (Bilateral)  Patient Location: PACU  Anesthesia Type: General  Level of Consciousness: awake and alert   Airway and Oxygen Therapy: Patient Spontanous Breathing  Post-op Pain: mild  Post-op Assessment: Post-op Vital signs reviewed, Patient's Cardiovascular Status Stable, Respiratory Function Stable, Patent Airway and No signs of Nausea or vomiting  Last Vitals:  Filed Vitals:   06/02/14 1615  BP: 119/82  Pulse: 54  Temp:   Resp: 17    Post-op Vital Signs: stable   Complications: No apparent anesthesia complications

## 2014-06-02 NOTE — Anesthesia Procedure Notes (Signed)
Procedure Name: LMA Insertion Date/Time: 06/02/2014 2:40 PM Performed by: Norva PavlovALLAWAY, Sean Spivack G Pre-anesthesia Checklist: Patient identified, Emergency Drugs available, Suction available and Patient being monitored Patient Re-evaluated:Patient Re-evaluated prior to inductionOxygen Delivery Method: Circle System Utilized Preoxygenation: Pre-oxygenation with 100% oxygen Intubation Type: IV induction Ventilation: Mask ventilation without difficulty LMA: LMA inserted LMA Size: 4.0 Number of attempts: 1 Airway Equipment and Method: bite block Placement Confirmation: positive ETCO2 Tube secured with: Tape Dental Injury: Teeth and Oropharynx as per pre-operative assessment

## 2014-06-03 ENCOUNTER — Encounter (HOSPITAL_BASED_OUTPATIENT_CLINIC_OR_DEPARTMENT_OTHER): Payer: Self-pay | Admitting: Urology

## 2014-06-03 MED ORDER — IOHEXOL 350 MG/ML SOLN
INTRAVENOUS | Status: DC | PRN
  Administered 2014-06-02: 8 mL via URETHRAL

## 2014-06-21 ENCOUNTER — Ambulatory Visit (INDEPENDENT_AMBULATORY_CARE_PROVIDER_SITE_OTHER): Payer: Medicaid Other | Admitting: Family Medicine

## 2014-06-21 VITALS — BP 99/56 | HR 55 | Temp 98.0°F | Ht 70.0 in | Wt 143.3 lb

## 2014-06-21 DIAGNOSIS — B20 Human immunodeficiency virus [HIV] disease: Secondary | ICD-10-CM

## 2014-06-21 DIAGNOSIS — B659 Schistosomiasis, unspecified: Secondary | ICD-10-CM

## 2014-06-21 DIAGNOSIS — Z008 Encounter for other general examination: Secondary | ICD-10-CM

## 2014-06-21 DIAGNOSIS — Z0289 Encounter for other administrative examinations: Secondary | ICD-10-CM

## 2014-06-21 NOTE — Progress Notes (Signed)
[place] interpreter utilized during today's visit.  [facility] New [facility] Visit  HPI:  [facility] for a new patient appointment to establish general primary care.  Has history of HIV for which he has already been established with ID.  On ARV's, doing well with these.  Knows his medications and states he is compliant.  First told he had HIV about 3 years.   Also -- noted to have blood in his urine after arrival in [place].  Went to Urgent Care.  Referred to [facility].  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  He was prescribed Praziquantal at that visit. Has the paper script but has not yet filled this today.     Working at [employer].    ROS: See HPI  Immigrant Social History: - Name spelling correct?:  - Date arrived in [place]: Dec 2015 - Country of origin: [place] - Location of refugee camp (if applicable), how long there, and what caused patient to leave home country?: [place] -- but never lived in refugee camp.   - Primary language: [place]  -Requires intepreter (essentially speaks no English) - Class A/B conditions: HIV - Class B   Preventative Care History: -Seen at health department?: yes -need records   Past Medical Hx:  -HIV - positive schisto as above.    Past Surgical Hx:  -denies   Family Hx:  - unknown  PHYSICAL EXAM: BP 99/56 mmHg  Pulse 55  Temp(Src) 98 F (36.7 C) (Oral)  Ht 5\' 10"  (1.778 m)  Wt 143 lb 4.8 oz (65 kg)  BMI 20.56 kg/m2 Gen: Well NAD HEENT:  [place]/AT.  EOMI, PERRL.  MMM, tonsils non-erythematous, non-edematous.  External ears WNL, Bilateral TM's normal without retraction, redness or bulging.  Neck:  No LAD Lungs: CTABL Nl WOB Heart: RRR no MRG Abd: NABS, NT, ND Exts: Non edematous BL  LE, warm and well perfused.  Psych:  Not depressed or anxious appearing.  Linear and coherent thought process as evidenced by speech pattern. Smiles spontaneously.

## 2014-06-21 NOTE — Patient Instructions (Signed)
Make sure you take the medicine Biltricide.  Come back and see me if you have any problems or concerns.  Otherwise I will see you next year.

## 2014-06-22 ENCOUNTER — Telehealth: Payer: Self-pay | Admitting: Infectious Disease

## 2014-06-22 NOTE — Telephone Encounter (Signed)
It doesn't show interaction according one database.

## 2014-06-22 NOTE — Telephone Encounter (Signed)
Received referral from Urology for rx of schistosomiasis.  Pt just seen by Urology for his chronic hematuria and path back with schisto  Should bring in for praziquantel   Not sure if any  Interaction with his COBI

## 2014-06-22 NOTE — Telephone Encounter (Signed)
Nice! HoneywellSchistosomias hematobium. We will probably need to do test of cure after treatment

## 2014-06-23 DIAGNOSIS — B659 Schistosomiasis, unspecified: Secondary | ICD-10-CM | POA: Insufficient documentation

## 2014-06-23 DIAGNOSIS — Z0289 Encounter for other administrative examinations: Secondary | ICD-10-CM | POA: Insufficient documentation

## 2014-06-23 NOTE — Assessment & Plan Note (Signed)
Currently seems to be doing well with this.   Followed by ID.  Encouraged compliance with both ARVs and ID appointments.  He expressed understanding.

## 2014-06-23 NOTE — Assessment & Plan Note (Addendum)
Diagnosed via cystocopy and biopsy. Have not seen urology notes -- will likely need maintance for bladder CA with known schisto infection and diagnosis of squamous hyperplasia.   He showed me his script for Praziquantal.  Encouraged through interpreter to pick this up from pharmacy.

## 2014-06-23 NOTE — Assessment & Plan Note (Signed)
Overall he's doing well. Awaiting labs from health dept.

## 2014-06-30 ENCOUNTER — Telehealth: Payer: Self-pay | Admitting: *Deleted

## 2014-06-30 NOTE — Telephone Encounter (Signed)
Darel HongJudy with [facility] called regarding a referral that Dr. Daiva EvesVan Dam is aware of [phone] ext [phone]. No available appointments until July. Patient sees Dr. Ninetta LightsHatcher for HIV; last appt was 03/2014. Please advise

## 2014-06-30 NOTE — Telephone Encounter (Signed)
Yes this patient needs to be seen ASAP he is alredady established pt. He has schistomiasis. Are there any openings with Dr. Luciana Axeomer or Drue SecondSnider this Friday? Otherwise I can try to work in tomorrow am --IF it is do-able, or Wed when I get back. I was under impression we were getting him to clinic asap  Mihh can you check on availablility of pariquantel?

## 2014-07-01 ENCOUNTER — Telehealth: Payer: Self-pay | Admitting: Infectious Disease

## 2014-07-01 MED ORDER — PRAZIQUANTEL 600 MG PO TABS: 2400.0000 mg | ORAL_TABLET | ORAL | Status: DC

## 2014-07-01 NOTE — Telephone Encounter (Signed)
Pt needs to start praziquantel.  Pharmacy to call via translator to arrange this  He also DOES need to be seen here in clinic and IS an established pt

## 2014-07-07 ENCOUNTER — Emergency Department (HOSPITAL_COMMUNITY)
Admission: EM | Admit: 2014-07-07 | Discharge: 2014-07-08 | Disposition: A | Discharge status: Home / Self Care | Payer: Medicaid Other | Attending: Emergency Medicine | Admitting: Emergency Medicine

## 2014-07-07 ENCOUNTER — Emergency Department (HOSPITAL_COMMUNITY): Payer: Medicaid Other

## 2014-07-07 ENCOUNTER — Encounter (HOSPITAL_COMMUNITY): Payer: Self-pay | Admitting: *Deleted

## 2014-07-07 DIAGNOSIS — Z79899 Other long term (current) drug therapy: Secondary | ICD-10-CM | POA: Insufficient documentation

## 2014-07-07 DIAGNOSIS — Z21 Asymptomatic human immunodeficiency virus [HIV] infection status: Secondary | ICD-10-CM | POA: Insufficient documentation

## 2014-07-07 DIAGNOSIS — B029 Zoster without complications: Secondary | ICD-10-CM

## 2014-07-07 DIAGNOSIS — M549 Dorsalgia, unspecified: Secondary | ICD-10-CM | POA: Insufficient documentation

## 2014-07-07 LAB — HEPATIC FUNCTION PANEL
ALK PHOS: 57 U/L (ref 38–126)
ALT: 21 U/L (ref 17–63)
AST: 27 U/L (ref 15–41)
Albumin: 3.5 g/dL (ref 3.5–5.0)
TOTAL PROTEIN: 7.2 g/dL (ref 6.5–8.1)
Total Bilirubin: 0.3 mg/dL (ref 0.3–1.2)

## 2014-07-07 LAB — CBC
HCT: 41.2 % (ref 39.0–52.0)
Hemoglobin: 14.3 g/dL (ref 13.0–17.0)
MCH: 32.2 pg (ref 26.0–34.0)
MCHC: 34.7 g/dL (ref 30.0–36.0)
MCV: 92.8 fL (ref 78.0–100.0)
Platelets: 166 10*3/uL (ref 150–400)
RBC: 4.44 MIL/uL (ref 4.22–5.81)
RDW: 11.9 % (ref 11.5–15.5)
WBC: 5.7 10*3/uL (ref 4.0–10.5)

## 2014-07-07 LAB — LIPASE, BLOOD: LIPASE: 28 U/L (ref 22–51)

## 2014-07-07 LAB — BASIC METABOLIC PANEL
Anion gap: 6 (ref 5–15)
BUN: 12 mg/dL (ref 6–20)
CALCIUM: 9.1 mg/dL (ref 8.9–10.3)
CO2: 30 mmol/L (ref 22–32)
Chloride: 102 mmol/L (ref 101–111)
Creatinine, Ser: 1.21 mg/dL (ref 0.61–1.24)
GFR calc Af Amer: >60 mL/min (ref >60)
GFR calc non Af Amer: >60 mL/min (ref >60)
GLUCOSE: 104 mg/dL — AB (ref 65–99)
Potassium: 4.1 mmol/L (ref 3.5–5.1)
Sodium: 138 mmol/L (ref 135–145)

## 2014-07-07 LAB — I-STAT TROPONIN, ED: Troponin i, poc: 0 ng/mL (ref 0.00–0.08)

## 2014-07-07 MED ORDER — VALACYCLOVIR HCL 500 MG PO TABS
1000.0000 mg | ORAL_TABLET | Freq: Once | ORAL | Status: AC
  Administered 2014-07-08: 1000 mg via ORAL
  Filled 2014-07-07: qty 2

## 2014-07-07 NOTE — ED Notes (Addendum)
Pt arrives via EMS c/o rt sided chest and back pain x 2 weeks. Pt currently taking Genvoya and Biltricide. Had prescription for Biltricide filled May 16. Pt only speaks french - used the hospital telephone service to translate.

## 2014-07-07 NOTE — ED Provider Notes (Signed)
CSN: [id number]     Arrival date & time 07/07/14  2117 History  This chart was scribed for Marisa Severinlga Anniece Bleiler, MD by Annye AsaAnna Dorsett, ED Scribe. This patient was seen in room B18C/B18C and the patient's care was started at 11:30 PM.    Chief Complaint  Patient presents with  . Chest Pain   The history is provided by the patient. No language interpreter was used.     HPI Comments: Sean Dodson is a 34 y.o. male who presents to the Emergency Department complaining of two weeks of right-sided chest pain and "pressure-like" back pain. Patient also notes, "My chest is very hot." He notes pain with breathing that improves only on lying supine. Patient explains tonight his pain worsened tonight, but is unable to specify further. He denies fever, difficulty eating or drinking. He denies recent sick contacts.   Past Medical History  Diagnosis Date  . HIV disease   . Gross hematuria    Past Surgical History  Procedure Laterality Date  . Cystoscopy w/ ureteral stent placement Bilateral 06/02/2014    Procedure: CYSTOSCOPY/BLADDER BIOPSY WITH BILATERAL RETROGRADE PYELOGRAM;  Surgeon: Crist FatBenjamin W Herrick, MD;  [facility];  Service: Urology;  Laterality: Bilateral;   Family History  Problem Relation Age of Onset  . Family history unknown: Yes   History  Substance Use Topics  . Smoking status: Never Smoker   . Smokeless tobacco: Never Used  . Alcohol Use: No    Review of Systems  Constitutional: Negative for fever.  Cardiovascular: Positive for chest pain.  Musculoskeletal: Positive for back pain.  All other systems reviewed and are negative.   Allergies  Novacort and Quinine derivatives  Home Medications   Prior to Admission medications   Medication Sig Start Date End Date Taking? Authorizing Provider  acetaminophen-codeine (TYLENOL #3) 300-30 MG per tablet Take 1 tablet by mouth every 4 (four) hours as needed. 06/02/14   Crist FatBenjamin W Herrick, MD   elvitegravir-cobicistat-emtricitabine-tenofovir (GENVOYA) 150-150-200-10 MG TABS tablet Take 1 tablet by mouth daily with breakfast. 02/23/14   Ginnie SmartJeffrey C Hatcher, MD  phenazopyridine (PYRIDIUM) 100 MG tablet Take 2 tablets (200 mg total) by mouth 3 (three) times daily as needed for pain (burning with urination). 06/02/14   Crist FatBenjamin W Herrick, MD  praziquantel (BILTRICIDE) 600 MG tablet Take 4 tablets (2,400 mg total) by mouth every 28 (twenty-eight) days. 07/01/14   Randall Hissornelius N Van Dam, MD   BP 114/70 mmHg  Pulse 83  Temp(Src) 98.5 F (36.9 C) (Oral)  Resp 16  SpO2 96% Physical Exam  Constitutional: He is oriented to person, place, and time. He appears well-developed and well-nourished. No distress.  HENT:  Head: Normocephalic and atraumatic.  Nose: Nose normal.  Mouth/Throat: Oropharynx is clear and moist.  Moist mucous membranes  Eyes: EOM are normal. Pupils are equal, round, and reactive to light.  Neck: Normal range of motion. Neck supple. No JVD present.  Cardiovascular: Normal rate, regular rhythm, normal heart sounds and intact distal pulses.   No murmur heard. Pulmonary/Chest: Effort normal and breath sounds normal. No respiratory distress. He has no wheezes. He has no rales.  Abdominal: Soft. Bowel sounds are normal. There is no tenderness.  Musculoskeletal: Normal range of motion. He exhibits no edema or tenderness.  Moves all extremities normally.   Neurological: He is alert and oriented to person, place, and time.  Skin: Skin is warm and dry. Rash noted.  Rash consistent with shingles to right lateral ribs.  Starting  below scapula and wrapping around.  There is a band of vesicles without scabbing, with underlying erythema  Psychiatric: He has a normal mood and affect. His behavior is normal.  Nursing note and vitals reviewed.   ED Course  Procedures   DIAGNOSTIC STUDIES: Oxygen Saturation is 96% on RA, adequate by my interpretation.    COORDINATION OF CARE: 11:43 PM  Discussed treatment plan with pt at bedside and pt agreed to plan.   Labs Review Labs Reviewed  BASIC METABOLIC PANEL - Abnormal; Notable for the following:    Glucose, Bld 104 (*)    All other components within normal limits  HEPATIC FUNCTION PANEL - Abnormal; Notable for the following:    Bilirubin, Direct <0.1 (*)    All other components within normal limits  CBC  LIPASE, BLOOD  I-STAT TROPOININ, ED    Imaging Review Dg Chest 2 View  07/07/2014   CLINICAL DATA:  Right-sided chest and back pain for 2 weeks.  EXAM: CHEST  2 VIEW  COMPARISON:  None.  FINDINGS: The cardiomediastinal contours are normal. The lungs are clear. Pulmonary vasculature is normal. No consolidation, pleural effusion, or pneumothorax. No acute osseous abnormalities are seen.  IMPRESSION: No acute pulmonary process.   Electronically Signed   By: Rubye Oaks M.D.   On: 07/07/2014 21:53     EKG Interpretation   Date/Time:  Wednesday July 07 2014 21:27:34 EDT Ventricular Rate:  89 PR Interval:  150 QRS Duration: 80 QT Interval:  332 QTC Calculation: 403 R Axis:   79 Text Interpretation:  Normal sinus rhythm Right atrial enlargement  Borderline ECG Confirmed by Ravin Bendall  MD, Arzell Mcgeehan ([id number]) on 07/07/2014 11:05:47  PM      MDM   Final diagnoses:  Herpes zoster    I personally performed the services described in this documentation, which was scribed in my presence. The recorded information has been reviewed and is accurate.  34 year old male with right-sided chest and back pain for 2 weeks.  Exam with dermatomal distribution of vesicles and erythema consistent with herpes zoster.  He has history of schistosomiasis and HIV disease.  I will send a note to the ID clinic to make sure that he has appropriate follow-up.     Marisa Severin, MD 07/08/14 [id number]

## 2014-07-08 MED ORDER — VALACYCLOVIR HCL 1 G PO TABS: 1000.0000 mg | ORAL_TABLET | Freq: Three times a day (TID) | ORAL | Status: DC

## 2014-07-08 MED ORDER — HYDROCODONE-ACETAMINOPHEN 5-325 MG PO TABS: 1.0000 | ORAL_TABLET | ORAL | Status: DC | PRN

## 2014-07-08 NOTE — Discharge Instructions (Signed)
Take medications as prescribed.  Contact the ID clinic in the morning for close follow up.  Keep rash covered.  Blisters will rupture and crust over.   Prenez les mdicaments prescrits. Contactez la [employer]clinique d'identit dans la matine pour un suivi troit. Gardez une ruption cutane couverte. Cloques se rompre et former des crotes.   Zona Shingles (herps zoster) est une infection cause par le mme virus qui cause la varicelle (varicelle). L'infection provoque une ruption cutane douloureuse et des cloques remplies de Hosmerliquide, qui a fini par briser ouvert, la crote sur, et de gurir. Il peut se produire dans une zone R.R. Donnelleydu corps, mais elle affecte habituellement seulement un ct du corps ou du visage. La douleur de bardeaux dure habituellement environ 1 mois. Cependant, certaines personnes atteintes de zona peuvent dvelopper  long terme la douleur (chronique) dans la zone Apache Corporationaffecte du corps. Shingles se produit souvent de nombreuses annes aprs que la personne a eu la varicelle. Il est plus frquent: Chez les personnes ges de 50 ans. Chez les personnes ayant un systme immunitaire affaibli, comme les personnes atteintes du VIH, le sida ou le cancer. Chez les personnes qui prennent des mdicaments qui affaiblissent le systme immunitaire, tels que les mdicaments de transplantation. Chez les personnes de moins de grand stress. CAUSES Le zona est caus par le virus varicelle-zona (VZV), qui entrane galement la varicelle. Aprs une personne est infecte par le virus, il peut rester Verizondans le corps de la personne pendant des annes dans un tat inactif (en sommeil). Pour provoquer le zona, le virus se ractive et clate comme une infection dans une racine nerveuse. Le virus peut se propager Yahood'une personne  personne (contagieuse) par contact Medco Health Solutionsavec des cloques Energy Transfer Partnersouvertes du zona ruption. Il ne fera que se propager  des gens qui ne l'ont pas eu la varicelle. Lorsque ces Fifth Third Bancorppersonnes sont exposes au  virus, ils peuvent dvelopper la varicelle. Ils ne vont pas dvelopper le zona. Une fois que les ampoules tavelure plus, la personne est plus contagieuse et ne peut pas transmettre le virus  d'autres. SIGNES ET SYMPTMES Shingles montre par tapes. Les premiers symptmes peuvent tre Armed forces logistics/support/administrative officerla douleur, des Colgate Palmolivedmangeaisons, et des Humana Incpicotements dans une zone de la peau. Cette douleur est gnralement dcrite comme une brlure, lancinante, ou lancinante. Dans quelques jours ou quelques semaines, une ruption rouge douloureuse apparat dans la zone o la Lake Parkdouleur, des Nunapitchukdmangeaisons, des picotements et ont t ressenties. L'ruption est gnralement sur un ct du corps selon un motif en bande ou en forme de ceinture. Puis, l'ruption se transforme souvent en cloques remplies de liquide. Ils gale et Kelly Servicesschez dans environ 2-3 semaines. symptmes pseudo-grippaux peuvent galement se produire avec les premiers symptmes, l'ruption cutane, ou les ampoules. Ceux-ci peuvent inclure: Fivre. Des frissons. Mal de tte. maux d'estomac. DIAGNOSTIC Votre fournisseur de soins de sant effectuera un examen de la peau pour diagnostiquer le zona. Grattage de la peau ou des chantillons de fluides peuvent galement tre prises par les cloques. Cet chantillon sera examin sous un microscope ou envoy  un laboratoire pour des tests supplmentaires. TRAITEMENT Il n'y a pas de traitement spcifique pour les bardeaux. Votre fournisseur de soins de sant sera probablement prescrire des ONEOKmdicaments pour vous aider  grer Liz Claibornela douleur, rcuprer plus vite, et d'viter les problmes  long terme. Cela peut inclure des mdicaments antiviraux, des mdicaments anti-inflammatoires et Mirantanalgsiques. INSTRUCTIONS DE SOINS  DOMICILE Prenez un bain frais ou appliquer des compresses froides sur la zone de l'ruption cutane ou des cloques General Electriccomme  indiqu. Cela peut aider  la douleur et les dmangeaisons. Prenez uniquement les mdicaments selon les  instructions de votre fournisseur de soins de sant. Reste comme dirig par votre fournisseur de soins de sant. Gardez votre ruption cutane et des cloques Massachusetts Mutual Life savon doux et de l'eau frache ou comme dirig par votre fournisseur de soins de sant. Ne prenez pas vos ampoules ou gratter les ruptions cutanes. Appliquer une crme anti-dmangeaison ou crmes anesthsiantes  la zone Mellon Financial indiqu par votre fournisseur de soins de sant. Gardez vos bardeaux ruption cutane recouverts d'un bandage lche (dressing). viter tout contact avec la peau: Bbs. Femmes enceintes. Les enfants atteints d'eczma. Les personnes ges avec des greffes. Les personnes atteintes de maladies chroniques, telles que la leucmie ou le sida. Portez des vtements amples pour Geneticist, molecular d'un matriau frottant contre l'ruption. Conservez toutes les visites de suivi comme indiqu par votre fournisseur de soins de sant. Si la zone Lubrizol Corporation votre visage, vous pouvez recevoir un renvoi pour un spcialiste, Air traffic controller un ophtalmologiste (ophtalmologiste) ou une oreille, le nez et la gorge (ORL). Garder toutes les visites de suivi vous aidera  viter des Washington Mutual, la douleur chronique, ou d'invalidit. SEEK IMMDIATE SOINS MDICAUX SI: Vous avez la douleur faciale, douleur autour de la zone des yeux, ou perte de sensation d'un ct de votre visage. Vous avez des Newmont Mining ou de sonnerie dans votre [place]. Vous avez une perte de got. Votre douleur ne disparat pas avec des mdicaments prescrits. Votre rougeur ou Huntsman Corporation spreads. Vous avez plus de douleur et l'enflure. Votre tat se dtriore ou a chang. Tu as de Social research officer, government. ASSUREZ-VOUS: Comprendre ces instructions. Est-ce que regarder votre condition. Recevront de Celanese Corporation de suite si vous ne faites pas Pharmacist, community. Document de Sortie: 01/22/2005 Document de rvision: 05/10/2013 Document de  rvision: 09/06/2011 ExitCare Information pour les patients  2015 [place], [place]. Cette information ne vise pas  remplacer les conseils donns par votre fournisseur de soins de sant. Assurez-vous de discuter de toutes les questions que vous avez avec votre fournisseur de soins de sant.

## 2014-07-12 ENCOUNTER — Telehealth: Payer: Self-pay | Admitting: *Deleted

## 2014-07-12 NOTE — Telephone Encounter (Signed)
Patient scheduled, accepted appointment for 6/13 at 8:45.  RN used [employer]pre[employer] [phone]#[id number] Chester Holsteindeye to make the appointment for the patient.  Andree CossHowell, Eran Mistry M, RN

## 2014-07-14 ENCOUNTER — Telehealth: Payer: Self-pay | Admitting: *Deleted

## 2014-07-14 NOTE — Telephone Encounter (Signed)
Patient helper called to advise that the patient Medicaid has stopped and he can not get his medication. Advised her he has an appt Monday 07/19/14 and we can try to sign him up for ADAP/ Juanell Fairlyyan White at that time he will just need to talk to the doctor and [place] when he is here. She advised the patient went to the ED recently and has a Rx for pain medication and wonders where he can get it filled. Advised him we can not do anything about the pain medication but we can help him get his HIV meds.

## 2014-07-19 ENCOUNTER — Other Ambulatory Visit: Payer: Self-pay | Admitting: *Deleted

## 2014-07-19 ENCOUNTER — Ambulatory Visit (INDEPENDENT_AMBULATORY_CARE_PROVIDER_SITE_OTHER): Payer: Self-pay | Admitting: Infectious Diseases

## 2014-07-19 ENCOUNTER — Encounter: Payer: Self-pay | Admitting: Infectious Diseases

## 2014-07-19 VITALS — BP 105/66 | HR 61 | Temp 98.6°F | Ht 69.69 in | Wt 139.0 lb

## 2014-07-19 DIAGNOSIS — B20 Human immunodeficiency virus [HIV] disease: Secondary | ICD-10-CM

## 2014-07-19 DIAGNOSIS — B659 Schistosomiasis, unspecified: Secondary | ICD-10-CM

## 2014-07-19 MED ORDER — ELVITEG-COBIC-EMTRICIT-TENOFAF 150-150-200-10 MG PO TABS: 1.0000 | ORAL_TABLET | Freq: Every day | ORAL | Status: DC

## 2014-07-19 NOTE — Assessment & Plan Note (Signed)
Appears asx. He has his second dose in hand and will take at end of month.  My great appreciation to uro and [facility]PTS

## 2014-07-19 NOTE — Progress Notes (Signed)
   Subjective:    Patient ID: Sean Dodson, male    DOB: [birth year], 34 y.o.   MRN: [id number]  HPI 34 yo M with newly dx 02-2014 HIV+. Was born in [place], came to [place] in end of 2015. He was started on [place] in January. He states that he quit taking this as his insurance was no longer working.  He has had f/u at [facility] as well as Urology, he was recently dx with schistosomiasis (seen on bx 05-2014). He received his first dose of prziquantel May 27, will get second dose after 4 weeks.   HIV 1 RNA QUANT (copies/mL)  Date Value  01/27/2014 11709*   CD4 T CELL ABS (/uL)  Date Value  01/27/2014 230*  01/11/2014 470    Review of Systems  Constitutional: Negative for appetite change and unexpected weight change.  Gastrointestinal: Negative for diarrhea and constipation.  Genitourinary: Negative for hematuria and difficulty urinating.       Objective:   Physical Exam  Constitutional: He appears well-developed and well-nourished.  HENT:  Mouth/Throat: No oropharyngeal exudate.  Eyes: EOM are normal. Pupils are equal, round, and reactive to light.  Neck: Neck supple.  Cardiovascular: Normal rate, regular rhythm and normal heart sounds.   Pulmonary/Chest: Effort normal and breath sounds normal.  Abdominal: Soft. Bowel sounds are normal. There is no tenderness.  Lymphadenopathy:    He has no cervical adenopathy.          Assessment & Plan:

## 2014-07-19 NOTE — Assessment & Plan Note (Signed)
Will continue his previously genvoya, he has been signed up for ADAP. Will meet with DAP coordinator. He is hep A/B immune. Will see him back in 3-4 months.  He is given condoms.

## 2014-08-03 ENCOUNTER — Encounter: Payer: Self-pay | Admitting: Infectious Diseases

## 2014-08-18 ENCOUNTER — Encounter: Payer: Self-pay | Admitting: Infectious Diseases

## 2014-08-27 ENCOUNTER — Encounter: Payer: Self-pay | Admitting: Infectious Diseases

## 2014-08-27 ENCOUNTER — Ambulatory Visit (INDEPENDENT_AMBULATORY_CARE_PROVIDER_SITE_OTHER): Payer: Self-pay | Admitting: Infectious Diseases

## 2014-08-27 VITALS — BP 117/75 | HR 72 | Temp 98.1°F | Wt 141.0 lb

## 2014-08-27 DIAGNOSIS — Z113 Encounter for screening for infections with a predominantly sexual mode of transmission: Secondary | ICD-10-CM

## 2014-08-27 DIAGNOSIS — B659 Schistosomiasis, unspecified: Secondary | ICD-10-CM

## 2014-08-27 DIAGNOSIS — Z79899 Other long term (current) drug therapy: Secondary | ICD-10-CM

## 2014-08-27 DIAGNOSIS — B20 Human immunodeficiency virus [HIV] disease: Secondary | ICD-10-CM

## 2014-08-27 LAB — COMPREHENSIVE METABOLIC PANEL
ALBUMIN: 4 g/dL (ref 3.5–5.2)
ALK PHOS: 49 U/L (ref 39–117)
ALT: 22 U/L (ref 0–53)
AST: 26 U/L (ref 0–37)
BILIRUBIN TOTAL: 0.6 mg/dL (ref 0.2–1.2)
BUN: 15 mg/dL (ref 6–23)
CO2: 29 mEq/L (ref 19–32)
CREATININE: 0.92 mg/dL (ref 0.50–1.35)
Calcium: 9.7 mg/dL (ref 8.4–10.5)
Chloride: 103 mEq/L (ref 96–112)
Glucose, Bld: 86 mg/dL (ref 70–99)
POTASSIUM: 4.2 meq/L (ref 3.5–5.3)
Sodium: 141 mEq/L (ref 135–145)
Total Protein: 7.2 g/dL (ref 6.0–8.3)

## 2014-08-27 LAB — LIPID PANEL
Cholesterol: 169 mg/dL (ref 0–200)
HDL: 77 mg/dL (ref >=40)
LDL Cholesterol: 75 mg/dL (ref 0–99)
Total CHOL/HDL Ratio: 2.2 Ratio
Triglycerides: 84 mg/dL (ref <150)
VLDL: 17 mg/dL (ref 0–40)

## 2014-08-27 LAB — CBC
HCT: 42.9 % (ref 39.0–52.0)
HEMOGLOBIN: 15 g/dL (ref 13.0–17.0)
MCH: 32 pg (ref 26.0–34.0)
MCHC: 35 g/dL (ref 30.0–36.0)
MCV: 91.5 fL (ref 78.0–100.0)
MPV: 11.3 fL (ref 8.6–12.4)
Platelets: 154 10*3/uL (ref 150–400)
RBC: 4.69 MIL/uL (ref 4.22–5.81)
RDW: 12.3 % (ref 11.5–15.5)
WBC: 2.9 10*3/uL — ABNORMAL LOW (ref 4.0–10.5)

## 2014-08-27 LAB — T-HELPER CELL (CD4) - (RCID CLINIC ONLY)
CD4 T CELL HELPER: 26 % — AB (ref 33–55)
CD4 T Cell Abs: 420 /uL (ref 400–2700)

## 2014-08-27 MED ORDER — ELVITEG-COBIC-EMTRICIT-TENOFAF 150-150-200-10 MG PO TABS: 1.0000 | ORAL_TABLET | Freq: Every day | ORAL | Status: DC

## 2014-08-27 NOTE — Assessment & Plan Note (Signed)
Will recheck his urine and stool O & P at next visit.

## 2014-08-27 NOTE — Assessment & Plan Note (Addendum)
Is getting ART via mail order which he appreciates.  Will need to check with ADAP coordinator.  Will check his labs today.  Offered/refused condoms.  rtc 6 months

## 2014-08-27 NOTE — Addendum Note (Signed)
Addended by: Mariea Clonts D on: 08/27/2014 09:56 AM   Modules accepted: Orders

## 2014-08-27 NOTE — Progress Notes (Signed)
   Subjective:    Patient ID: Sean Dodson, male    DOB: February 11, 1980, 34 y.o.   MRN: [id number]  HPI33 yo M with newly dx 02-2014 HIV+. Was born in [place], came to [place] in end of 2015. Also dx, tx for schistosomiasis 2016.  Has been feeling well. Is taking genvoya. Took last dose of schisto rx last month (has taken 2 doses 1 month appart). Will need repeat urine and stool O & P in 6 months.    HIV 1 RNA QUANT (copies/mL)  Date Value  01/27/2014 11709*   CD4 T CELL ABS (/uL)  Date Value  01/27/2014 230*  01/11/2014 470      Review of Systems  Constitutional: Negative for appetite change and unexpected weight change.  Gastrointestinal: Negative for diarrhea and constipation.  Genitourinary: Negative for dysuria, hematuria and difficulty urinating.       Objective:   Physical Exam  Constitutional: He appears well-developed and well-nourished.  HENT:  Mouth/Throat: No oropharyngeal exudate.  Eyes: EOM are normal. Pupils are equal, round, and reactive to light.  Neck: Neck supple.  Cardiovascular: Normal rate, regular rhythm and normal heart sounds.   Pulmonary/Chest: Effort normal and breath sounds normal.  Abdominal: Soft. Bowel sounds are normal. There is no tenderness. There is no rebound.  Lymphadenopathy:    He has no cervical adenopathy.      Assessment & Plan:

## 2014-08-28 LAB — RPR

## 2014-08-30 LAB — URINE CYTOLOGY ANCILLARY ONLY
Chlamydia: NEGATIVE
Neisseria Gonorrhea: NEGATIVE

## 2014-08-31 LAB — HIV-1 RNA QUANT-NO REFLEX-BLD

## 2014-09-03 ENCOUNTER — Encounter: Payer: Self-pay | Admitting: Infectious Diseases

## 2014-09-06 ENCOUNTER — Encounter: Payer: Self-pay | Admitting: Infectious Diseases

## 2014-09-18 ENCOUNTER — Encounter: Payer: Self-pay | Admitting: Infectious Diseases

## 2014-09-20 ENCOUNTER — Other Ambulatory Visit: Payer: Self-pay | Admitting: *Deleted

## 2014-09-20 DIAGNOSIS — B20 Human immunodeficiency virus [HIV] disease: Secondary | ICD-10-CM

## 2014-09-20 MED ORDER — ELVITEG-COBIC-EMTRICIT-TENOFAF 150-150-200-10 MG PO TABS: 1.0000 | ORAL_TABLET | Freq: Every day | ORAL | Status: DC

## 2014-10-25 NOTE — Progress Notes (Signed)
Notified [facility] via fax.  Howell, Michelle M, RN  

## 2014-11-08 ENCOUNTER — Other Ambulatory Visit (INDEPENDENT_AMBULATORY_CARE_PROVIDER_SITE_OTHER): Payer: Self-pay

## 2014-11-08 DIAGNOSIS — B20 Human immunodeficiency virus [HIV] disease: Secondary | ICD-10-CM

## 2014-11-09 LAB — HIV-1 RNA QUANT-NO REFLEX-BLD: HIV 1 RNA Quant: <20 copies/mL (ref <20)

## 2014-11-09 LAB — T-HELPER CELL (CD4) - (RCID CLINIC ONLY)
CD4 % Helper T Cell: 25 % — ABNORMAL LOW (ref 33–55)
CD4 T Cell Abs: 420 /uL (ref 400–2700)

## 2014-11-18 ENCOUNTER — Encounter: Payer: Self-pay | Admitting: Infectious Diseases

## 2014-11-22 ENCOUNTER — Encounter: Payer: Self-pay | Admitting: Infectious Diseases

## 2014-11-22 ENCOUNTER — Ambulatory Visit (INDEPENDENT_AMBULATORY_CARE_PROVIDER_SITE_OTHER): Payer: Self-pay | Admitting: Infectious Diseases

## 2014-11-22 VITALS — BP 115/65 | HR 71 | Temp 97.7°F | Ht 69.69 in | Wt 141.0 lb

## 2014-11-22 DIAGNOSIS — B659 Schistosomiasis, unspecified: Secondary | ICD-10-CM

## 2014-11-22 DIAGNOSIS — B20 Human immunodeficiency virus [HIV] disease: Secondary | ICD-10-CM

## 2014-11-22 DIAGNOSIS — Z113 Encounter for screening for infections with a predominantly sexual mode of transmission: Secondary | ICD-10-CM

## 2014-11-22 DIAGNOSIS — Z79899 Other long term (current) drug therapy: Secondary | ICD-10-CM

## 2014-11-22 NOTE — Assessment & Plan Note (Signed)
He is doing very well.  Will continue his current ART.  Given condoms.  rtc in 6 months.

## 2014-11-22 NOTE — Assessment & Plan Note (Signed)
Will recheck his urine at f/u.

## 2014-11-22 NOTE — Progress Notes (Signed)
   Subjective:    Patient ID: Sean Dodson, male    DOB: 03/20/1980, 34 y.o.   MRN: [id number]  HPI 34 yo M with newly dx 02-2014 HIV+. [place] came [place]. Also dx, tx for schistosomiasis 2016. Needs repeat urine O & P spring 2017.  Has been feeling well. Is taking genvoya.  Hep A/B+  HIV 1 RNA QUANT (copies/mL)  Date Value  11/08/2014 <20  08/27/2014 <20  01/27/2014 11709*   CD4 T CELL ABS (/uL)  Date Value  11/08/2014 420  08/27/2014 420  01/27/2014 230*    Review of Systems  Constitutional: Negative for appetite change and unexpected weight change.  Gastrointestinal: Negative for diarrhea and constipation.  Genitourinary: Negative for dysuria, urgency, hematuria and difficulty urinating.       Objective:   Physical Exam  Constitutional: He appears well-developed and well-nourished.  HENT:  Mouth/Throat: No oropharyngeal exudate.  Eyes: EOM are normal. Pupils are equal, round, and reactive to light.  Neck: Neck supple.  Cardiovascular: Normal rate, regular rhythm and normal heart sounds.   Pulmonary/Chest: Effort normal and breath sounds normal.  Abdominal: Soft. Bowel sounds are normal. There is no tenderness. There is no rebound.  Lymphadenopathy:    He has no cervical adenopathy.       Assessment & Plan:

## 2014-12-01 ENCOUNTER — Encounter: Payer: Self-pay | Admitting: Infectious Diseases

## 2015-02-15 ENCOUNTER — Encounter: Payer: Self-pay | Admitting: Infectious Diseases

## 2015-02-17 ENCOUNTER — Ambulatory Visit: Payer: Self-pay

## 2015-09-05 ENCOUNTER — Encounter: Payer: Self-pay | Admitting: Infectious Diseases

## 2015-09-06 ENCOUNTER — Ambulatory Visit: Payer: Self-pay

## 2015-09-07 ENCOUNTER — Encounter: Payer: Self-pay | Admitting: Infectious Diseases

## 2015-09-30 ENCOUNTER — Other Ambulatory Visit: Payer: Self-pay | Admitting: Infectious Diseases

## 2015-09-30 ENCOUNTER — Encounter: Payer: Self-pay | Admitting: Infectious Diseases

## 2015-09-30 DIAGNOSIS — B20 Human immunodeficiency virus [HIV] disease: Secondary | ICD-10-CM

## 2015-10-24 ENCOUNTER — Other Ambulatory Visit (INDEPENDENT_AMBULATORY_CARE_PROVIDER_SITE_OTHER): Payer: Self-pay

## 2015-10-24 DIAGNOSIS — Z79899 Other long term (current) drug therapy: Secondary | ICD-10-CM

## 2015-10-24 DIAGNOSIS — B20 Human immunodeficiency virus [HIV] disease: Secondary | ICD-10-CM

## 2015-10-24 DIAGNOSIS — Z113 Encounter for screening for infections with a predominantly sexual mode of transmission: Secondary | ICD-10-CM

## 2015-10-24 LAB — COMPREHENSIVE METABOLIC PANEL
ALK PHOS: 52 U/L (ref 40–115)
ALT: 20 U/L (ref 9–46)
AST: 23 U/L (ref 10–40)
Albumin: 4.1 g/dL (ref 3.6–5.1)
BILIRUBIN TOTAL: 0.6 mg/dL (ref 0.2–1.2)
BUN: 11 mg/dL (ref 7–25)
CO2: 28 mmol/L (ref 20–31)
CREATININE: 0.97 mg/dL (ref 0.60–1.35)
Calcium: 9.5 mg/dL (ref 8.6–10.3)
Chloride: 101 mmol/L (ref 98–110)
Glucose, Bld: 117 mg/dL — ABNORMAL HIGH (ref 65–99)
Potassium: 4.8 mmol/L (ref 3.5–5.3)
SODIUM: 138 mmol/L (ref 135–146)
TOTAL PROTEIN: 7 g/dL (ref 6.1–8.1)

## 2015-10-24 LAB — LIPID PANEL
CHOLESTEROL: 208 mg/dL — AB (ref 125–200)
HDL: 78 mg/dL (ref >=40)
LDL Cholesterol: 117 mg/dL (ref <130)
Total CHOL/HDL Ratio: 2.7 Ratio (ref <=5.0)
Triglycerides: 64 mg/dL (ref <150)
VLDL: 13 mg/dL (ref <30)

## 2015-10-24 LAB — CBC
HCT: 44.4 % (ref 38.5–50.0)
Hemoglobin: 15.1 g/dL (ref 13.2–17.1)
MCH: 31.8 pg (ref 27.0–33.0)
MCHC: 34 g/dL (ref 32.0–36.0)
MCV: 93.5 fL (ref 80.0–100.0)
MPV: 12.3 fL (ref 7.5–12.5)
PLATELETS: 157 10*3/uL (ref 140–400)
RBC: 4.75 MIL/uL (ref 4.20–5.80)
RDW: 12.2 % (ref 11.0–15.0)
WBC: 3.5 10*3/uL — ABNORMAL LOW (ref 3.8–10.8)

## 2015-10-25 LAB — URINE CYTOLOGY ANCILLARY ONLY
Chlamydia: NEGATIVE
Neisseria Gonorrhea: NEGATIVE

## 2015-10-25 LAB — HIV-1 RNA QUANT-NO REFLEX-BLD: HIV-1 RNA Quant, Log: <1.3 Log copies/mL (ref <1.30)

## 2015-10-25 LAB — T-HELPER CELL (CD4) - (RCID CLINIC ONLY)
CD4 T CELL ABS: 450 /uL (ref 400–2700)
CD4 T CELL HELPER: 24 % — AB (ref 33–55)

## 2015-10-25 LAB — RPR

## 2015-10-29 ENCOUNTER — Other Ambulatory Visit: Payer: Self-pay | Admitting: Infectious Diseases

## 2015-10-29 DIAGNOSIS — B20 Human immunodeficiency virus [HIV] disease: Secondary | ICD-10-CM

## 2015-10-31 ENCOUNTER — Encounter: Payer: Self-pay | Admitting: *Deleted

## 2015-11-07 ENCOUNTER — Encounter: Payer: Self-pay | Admitting: Infectious Diseases

## 2015-11-07 ENCOUNTER — Ambulatory Visit (INDEPENDENT_AMBULATORY_CARE_PROVIDER_SITE_OTHER): Payer: Self-pay | Admitting: Infectious Diseases

## 2015-11-07 VITALS — BP 114/62 | HR 67 | Temp 98.3°F | Wt 146.4 lb

## 2015-11-07 DIAGNOSIS — B659 Schistosomiasis, unspecified: Secondary | ICD-10-CM

## 2015-11-07 DIAGNOSIS — Z113 Encounter for screening for infections with a predominantly sexual mode of transmission: Secondary | ICD-10-CM

## 2015-11-07 DIAGNOSIS — Z23 Encounter for immunization: Secondary | ICD-10-CM

## 2015-11-07 DIAGNOSIS — Z79899 Other long term (current) drug therapy: Secondary | ICD-10-CM

## 2015-11-07 DIAGNOSIS — B20 Human immunodeficiency virus [HIV] disease: Secondary | ICD-10-CM

## 2015-11-07 NOTE — Progress Notes (Signed)
   Subjective:    Patient ID: Sean Dodson, male    DOB: 05/29/1980, 35 y.o.   MRN: [id number]  HPI 35 yo M with newly dx 02-2014 HIV+. Was born in [place], [place] in [place] of [place]. Also dx, tx for schistosomiasis 2016. Needs repeat urine O & P spring 2017.  On genvoya. Denies missed doses.  Hep A/B+ Feels well.  HIV 1 RNA Quant (copies/mL)  Date Value  10/24/2015 <20  11/08/2014 <20  08/27/2014 <20   CD4 T Cell Abs (/uL)  Date Value  10/24/2015 450  11/08/2014 420  08/27/2014 420     Review of Systems  Constitutional: Negative for activity change and unexpected weight change.  Gastrointestinal: Negative for constipation and diarrhea.  Genitourinary: Negative for difficulty urinating.       Objective:   Physical Exam  Constitutional: He appears well-developed and well-nourished.  HENT:  Mouth/Throat: No oropharyngeal exudate.  Eyes: EOM are normal. Pupils are equal, round, and reactive to light.  Neck: Neck supple.  Cardiovascular: Normal rate, regular rhythm and normal heart sounds.   Pulmonary/Chest: Effort normal and breath sounds normal.  Abdominal: Soft. Bowel sounds are normal. There is no tenderness. There is no rebound.  Musculoskeletal: He exhibits no edema.  Lymphadenopathy:    He has no cervical adenopathy.       Assessment & Plan:

## 2015-11-07 NOTE — Assessment & Plan Note (Signed)
Will check urine o & p

## 2015-11-07 NOTE — Assessment & Plan Note (Signed)
He is doing well Gets flu shot today Offered/refused condoms.  rtc in 6 months.  Visit performed with interpreter.

## 2015-12-29 ENCOUNTER — Other Ambulatory Visit: Payer: Self-pay | Admitting: Infectious Diseases

## 2015-12-29 DIAGNOSIS — B20 Human immunodeficiency virus [HIV] disease: Secondary | ICD-10-CM

## 2016-04-23 ENCOUNTER — Other Ambulatory Visit (INDEPENDENT_AMBULATORY_CARE_PROVIDER_SITE_OTHER): Payer: Self-pay

## 2016-04-23 DIAGNOSIS — B20 Human immunodeficiency virus [HIV] disease: Secondary | ICD-10-CM

## 2016-04-23 DIAGNOSIS — Z79899 Other long term (current) drug therapy: Secondary | ICD-10-CM

## 2016-04-23 DIAGNOSIS — Z113 Encounter for screening for infections with a predominantly sexual mode of transmission: Secondary | ICD-10-CM

## 2016-04-23 LAB — LIPID PANEL
Cholesterol: 197 mg/dL (ref <200)
HDL: 72 mg/dL (ref >40)
LDL CALC: 104 mg/dL — AB (ref <100)
Total CHOL/HDL Ratio: 2.7 Ratio (ref <5.0)
Triglycerides: 107 mg/dL (ref <150)
VLDL: 21 mg/dL (ref <30)

## 2016-04-23 LAB — CBC
HCT: 47.6 % (ref 38.5–50.0)
Hemoglobin: 16.5 g/dL (ref 13.2–17.1)
MCH: 32.9 pg (ref 27.0–33.0)
MCHC: 34.7 g/dL (ref 32.0–36.0)
MCV: 94.8 fL (ref 80.0–100.0)
MPV: 12.1 fL (ref 7.5–12.5)
PLATELETS: 220 10*3/uL (ref 140–400)
RBC: 5.02 MIL/uL (ref 4.20–5.80)
RDW: 12.1 % (ref 11.0–15.0)
WBC: 3.4 10*3/uL — ABNORMAL LOW (ref 3.8–10.8)

## 2016-04-23 LAB — COMPREHENSIVE METABOLIC PANEL
ALK PHOS: 56 U/L (ref 40–115)
ALT: 22 U/L (ref 9–46)
AST: 27 U/L (ref 10–40)
Albumin: 3.9 g/dL (ref 3.6–5.1)
BILIRUBIN TOTAL: 0.6 mg/dL (ref 0.2–1.2)
BUN: 16 mg/dL (ref 7–25)
CO2: 32 mmol/L — AB (ref 20–31)
CREATININE: 0.95 mg/dL (ref 0.60–1.35)
Calcium: 9.8 mg/dL (ref 8.6–10.3)
Chloride: 103 mmol/L (ref 98–110)
Glucose, Bld: 105 mg/dL — ABNORMAL HIGH (ref 65–99)
Potassium: 4.2 mmol/L (ref 3.5–5.3)
SODIUM: 139 mmol/L (ref 135–146)
Total Protein: 7 g/dL (ref 6.1–8.1)

## 2016-04-24 LAB — T-HELPER CELL (CD4) - (RCID CLINIC ONLY)
CD4 % Helper T Cell: 24 % — ABNORMAL LOW (ref 33–55)
CD4 T CELL ABS: 450 /uL (ref 400–2700)

## 2016-04-24 LAB — URINE CYTOLOGY ANCILLARY ONLY
Chlamydia: NEGATIVE
Neisseria Gonorrhea: NEGATIVE

## 2016-04-24 LAB — RPR

## 2016-04-25 LAB — HIV-1 RNA QUANT-NO REFLEX-BLD
HIV 1 RNA QUANT: 62 {copies}/mL — AB
HIV-1 RNA Quant, Log: 1.79 Log copies/mL — ABNORMAL HIGH

## 2016-05-07 ENCOUNTER — Ambulatory Visit: Payer: Self-pay | Admitting: Infectious Diseases

## 2016-05-09 ENCOUNTER — Ambulatory Visit: Payer: Self-pay | Admitting: Infectious Diseases

## 2016-05-11 ENCOUNTER — Encounter: Payer: Self-pay | Admitting: Infectious Diseases

## 2016-05-14 ENCOUNTER — Encounter: Payer: Self-pay | Admitting: Infectious Diseases

## 2016-07-07 ENCOUNTER — Other Ambulatory Visit: Payer: Self-pay | Admitting: Infectious Diseases

## 2016-07-07 DIAGNOSIS — B20 Human immunodeficiency virus [HIV] disease: Secondary | ICD-10-CM

## 2016-07-09 ENCOUNTER — Encounter: Payer: Self-pay | Admitting: Infectious Diseases

## 2016-07-09 ENCOUNTER — Ambulatory Visit (INDEPENDENT_AMBULATORY_CARE_PROVIDER_SITE_OTHER): Payer: Self-pay | Admitting: Infectious Diseases

## 2016-07-09 VITALS — BP 117/78 | HR 67 | Temp 98.1°F | Ht 71.0 in | Wt 144.0 lb

## 2016-07-09 DIAGNOSIS — B20 Human immunodeficiency virus [HIV] disease: Secondary | ICD-10-CM

## 2016-07-09 DIAGNOSIS — Z23 Encounter for immunization: Secondary | ICD-10-CM

## 2016-07-09 DIAGNOSIS — Z79899 Other long term (current) drug therapy: Secondary | ICD-10-CM

## 2016-07-09 DIAGNOSIS — B659 Schistosomiasis, unspecified: Secondary | ICD-10-CM

## 2016-07-09 DIAGNOSIS — Z113 Encounter for screening for infections with a predominantly sexual mode of transmission: Secondary | ICD-10-CM

## 2016-07-09 NOTE — Assessment & Plan Note (Signed)
Will repeat his O & P

## 2016-07-09 NOTE — Assessment & Plan Note (Signed)
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

## 2016-07-09 NOTE — Progress Notes (Signed)
   Subjective:    Patient ID: Janeece AgeeSerge Starkes, male    DOB: 08/16/1980, 36 y.o.   MRN: [id number]  HPI 35yo M with newly dx 02-2014 HIV+. Was born in [place], [place] in [place]. Also dx, tx for schistosomiasis 2016. On genvoya. Denies missed doses.  Hep A/B+ Seen with interpreter via iPad.   HIV 1 RNA Quant (copies/mL)  Date Value  04/23/2016 62 (H)  10/24/2015 <20  11/08/2014 <20   CD4 T Cell Abs (/uL)  Date Value  04/23/2016 450  10/24/2015 450  11/08/2014 420    Review of Systems  Constitutional: Negative for appetite change, chills, fever and unexpected weight change.  Gastrointestinal: Negative for constipation and diarrhea.  Genitourinary: Negative for difficulty urinating.       Objective:   Physical Exam  Constitutional: He appears well-developed and well-nourished.  HENT:  Mouth/Throat: No oropharyngeal exudate.  Eyes: EOM are normal. Pupils are equal, round, and reactive to light.  Neck: Neck supple.  Cardiovascular: Normal rate, regular rhythm and normal heart sounds.   Pulmonary/Chest: Effort normal and breath sounds normal.  Abdominal: Soft. Bowel sounds are normal. There is no tenderness. There is no rebound.  Musculoskeletal: He exhibits no edema.  Lymphadenopathy:    He has no cervical adenopathy.  Psychiatric: He has a normal mood and affect.       Assessment & Plan:

## 2016-07-10 ENCOUNTER — Encounter: Payer: Self-pay | Admitting: *Deleted

## 2016-07-12 ENCOUNTER — Other Ambulatory Visit: Payer: Self-pay

## 2016-07-12 DIAGNOSIS — B659 Schistosomiasis, unspecified: Secondary | ICD-10-CM

## 2016-07-16 LAB — OVA AND PARASITE EXAMINATION: OP: NONE SEEN

## 2016-07-18 IMAGING — CR DG CHEST 2V
2 series · 2 of 2 positions shown · non-contrast
Comparison: None.

CLINICAL DATA: Right-sided chest and back pain for 2 weeks.

EXAM:
CHEST  2 VIEW

[w chest pa]
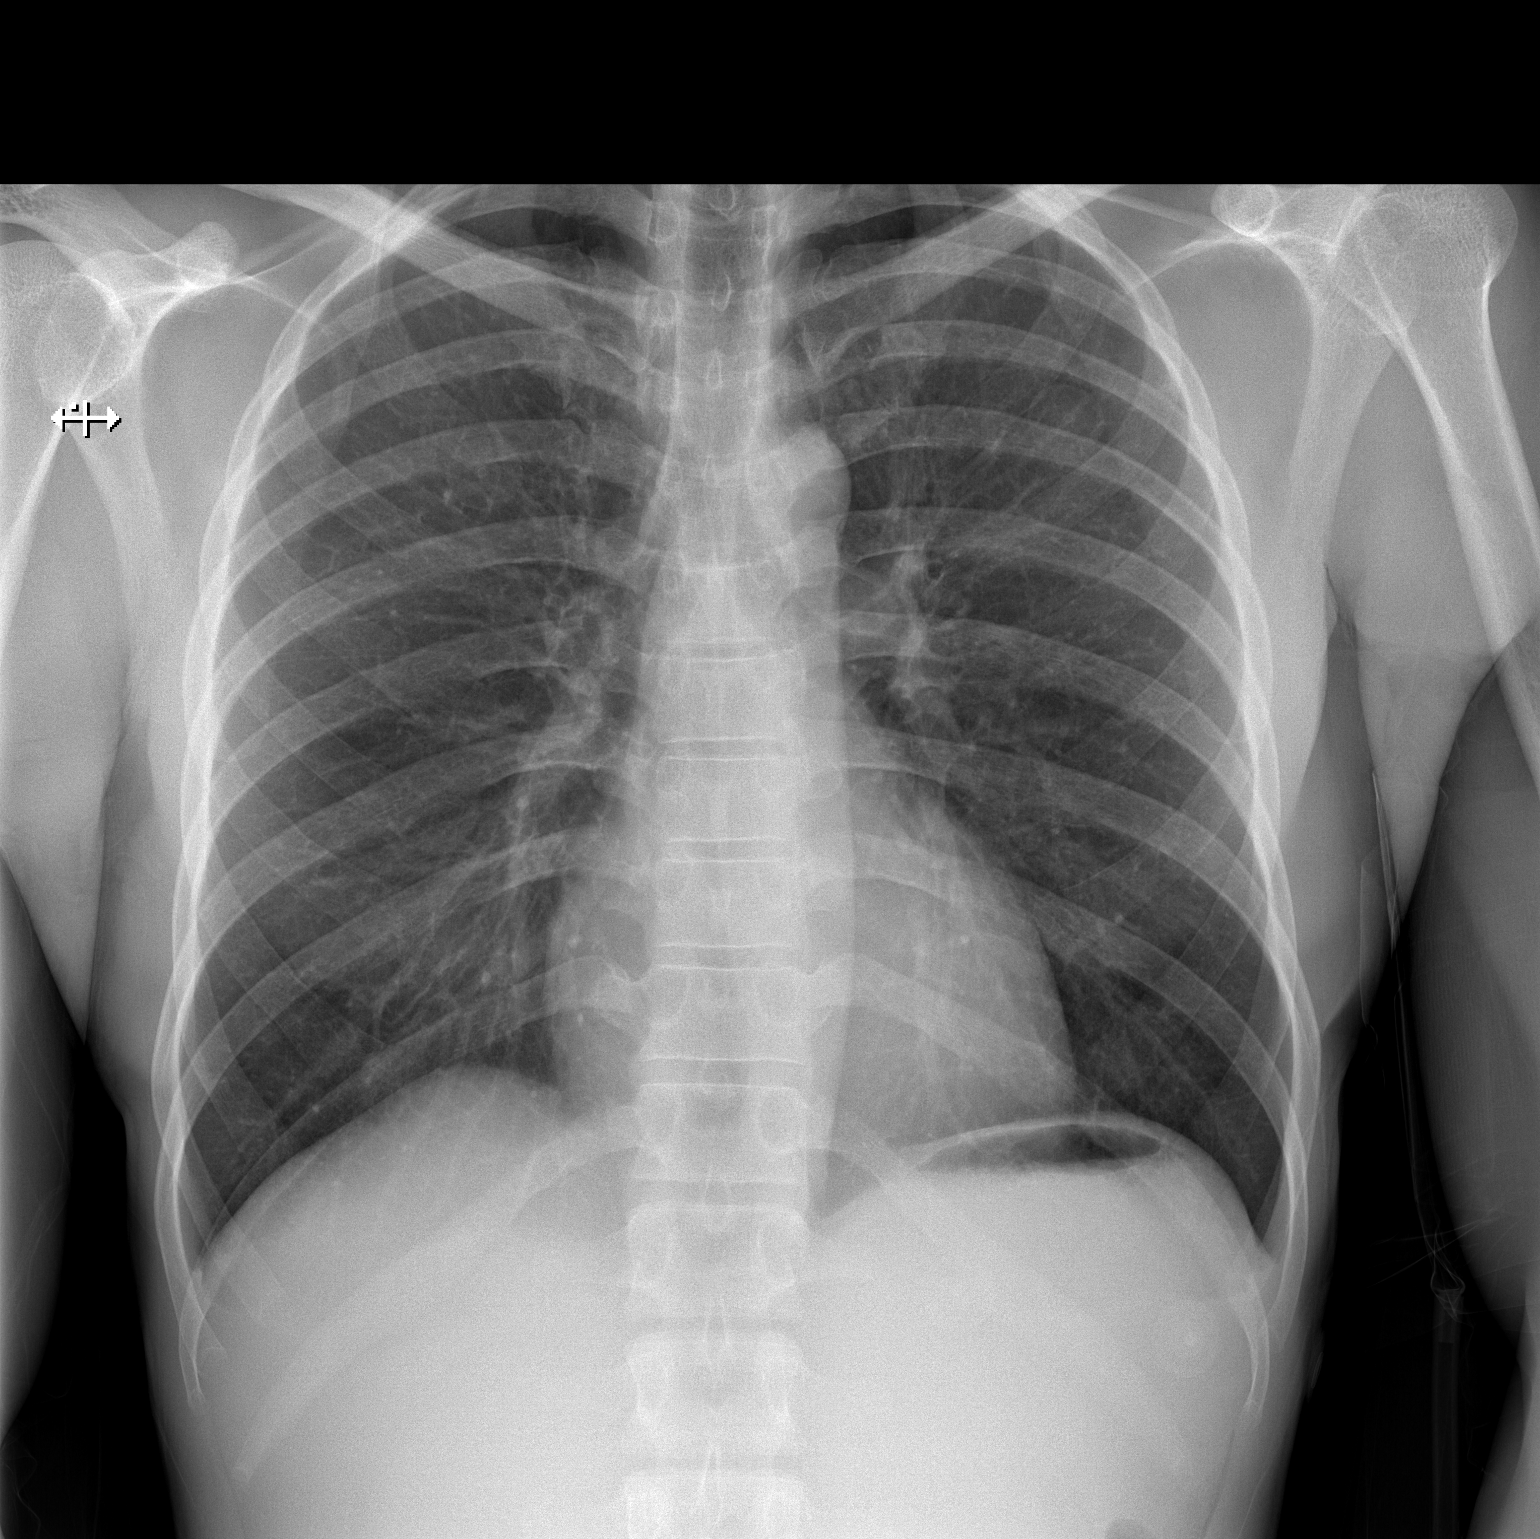

[w chest lat]
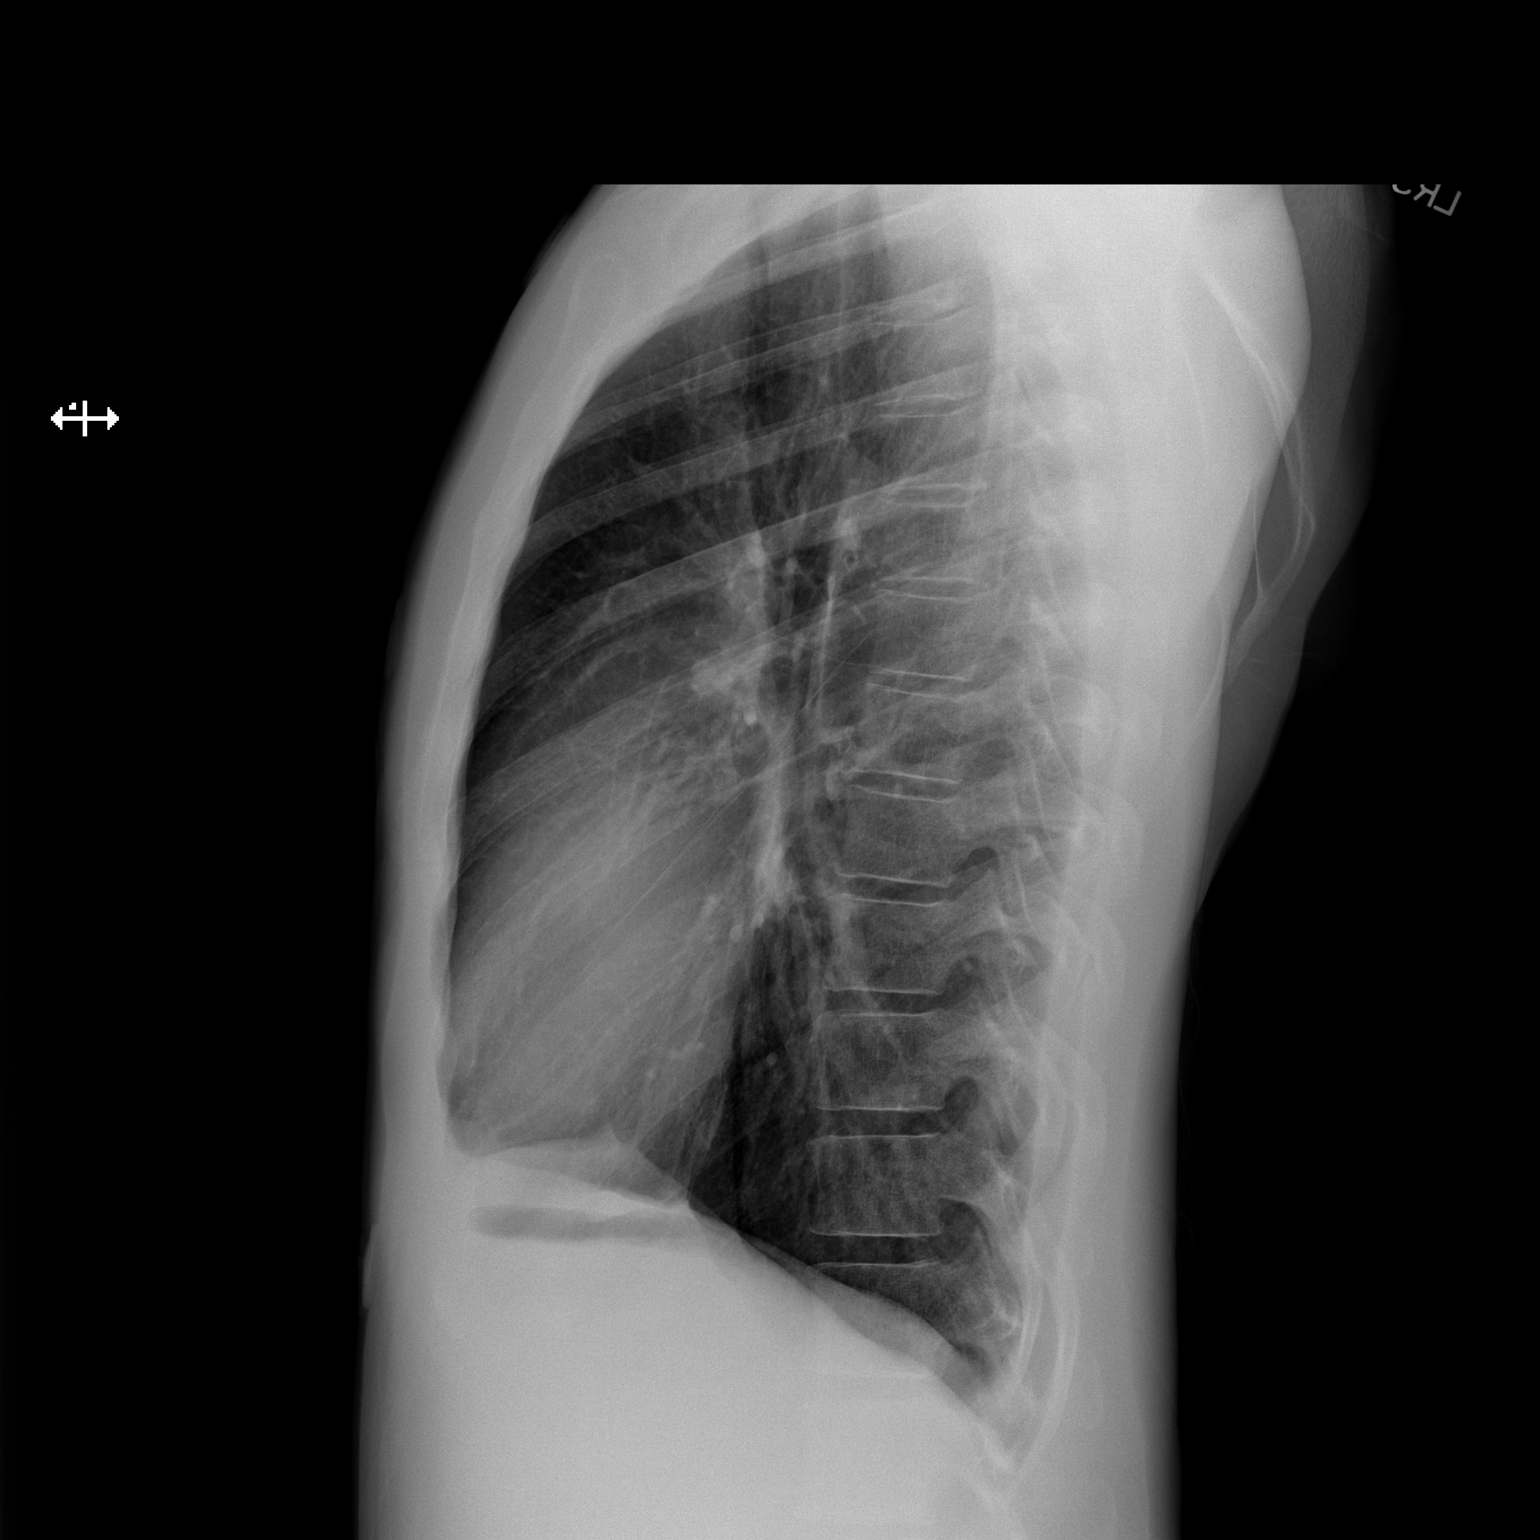

[2 of 2 positions shown; findings below may reference images not displayed]

FINDINGS: The cardiomediastinal contours are normal. The lungs are clear.
Pulmonary vasculature is normal. No consolidation, pleural effusion,
or pneumothorax. No acute osseous abnormalities are seen.
IMPRESSION: No acute pulmonary process.

## 2016-08-15 ENCOUNTER — Encounter: Payer: Self-pay | Admitting: Infectious Diseases

## 2016-08-15 NOTE — Telephone Encounter (Signed)
Patient was supposed to return for urine O & P for schistosoma.  He states he has been a contact for gonorrhea. Please advise if he should be treated or tested/treated for STD exposure. He is coming Monday at 2:15. Andree CossHowell, Saad Buhl M, RN

## 2016-08-20 ENCOUNTER — Other Ambulatory Visit: Payer: Self-pay

## 2016-08-20 DIAGNOSIS — Z113 Encounter for screening for infections with a predominantly sexual mode of transmission: Secondary | ICD-10-CM

## 2016-08-20 DIAGNOSIS — Z79899 Other long term (current) drug therapy: Secondary | ICD-10-CM

## 2016-08-20 DIAGNOSIS — B20 Human immunodeficiency virus [HIV] disease: Secondary | ICD-10-CM

## 2016-08-20 LAB — LIPID PANEL
CHOL/HDL RATIO: 2.9 ratio (ref <5.0)
CHOLESTEROL: 211 mg/dL — AB (ref <200)
HDL: 73 mg/dL (ref >40)
LDL Cholesterol: 122 mg/dL — ABNORMAL HIGH (ref <100)
TRIGLYCERIDES: 82 mg/dL (ref <150)
VLDL: 16 mg/dL (ref <30)

## 2016-08-20 LAB — COMPREHENSIVE METABOLIC PANEL
ALBUMIN: 4 g/dL (ref 3.6–5.1)
ALK PHOS: 52 U/L (ref 40–115)
ALT: 22 U/L (ref 9–46)
AST: 25 U/L (ref 10–40)
BUN: 18 mg/dL (ref 7–25)
CALCIUM: 9.3 mg/dL (ref 8.6–10.3)
CO2: 27 mmol/L (ref 20–31)
Chloride: 103 mmol/L (ref 98–110)
Creat: 1.07 mg/dL (ref 0.60–1.35)
Glucose, Bld: 87 mg/dL (ref 65–99)
POTASSIUM: 4.4 mmol/L (ref 3.5–5.3)
Sodium: 137 mmol/L (ref 135–146)
TOTAL PROTEIN: 6.9 g/dL (ref 6.1–8.1)
Total Bilirubin: 0.5 mg/dL (ref 0.2–1.2)

## 2016-08-20 LAB — CBC
HEMATOCRIT: 42.9 % (ref 38.5–50.0)
HEMOGLOBIN: 14.5 g/dL (ref 13.2–17.1)
MCH: 32.2 pg (ref 27.0–33.0)
MCHC: 33.8 g/dL (ref 32.0–36.0)
MCV: 95.1 fL (ref 80.0–100.0)
MPV: 11.5 fL (ref 7.5–12.5)
Platelets: 174 10*3/uL (ref 140–400)
RBC: 4.51 MIL/uL (ref 4.20–5.80)
RDW: 12.1 % (ref 11.0–15.0)
WBC: 3.7 10*3/uL — ABNORMAL LOW (ref 3.8–10.8)

## 2016-08-21 LAB — RPR

## 2016-08-21 LAB — T-HELPER CELL (CD4) - (RCID CLINIC ONLY)
CD4 % Helper T Cell: 31 % — ABNORMAL LOW (ref 33–55)
CD4 T Cell Abs: 730 /uL (ref 400–2700)

## 2016-08-22 LAB — HIV-1 RNA QUANT-NO REFLEX-BLD
HIV 1 RNA Quant: 83 copies/mL — ABNORMAL HIGH
HIV-1 RNA Quant, Log: 1.92 Log copies/mL — ABNORMAL HIGH

## 2016-12-31 ENCOUNTER — Encounter: Payer: Self-pay | Admitting: Infectious Diseases

## 2017-01-02 ENCOUNTER — Ambulatory Visit: Payer: Self-pay

## 2017-01-02 ENCOUNTER — Other Ambulatory Visit: Payer: Self-pay | Admitting: *Deleted

## 2017-01-02 DIAGNOSIS — B20 Human immunodeficiency virus [HIV] disease: Secondary | ICD-10-CM

## 2017-01-02 MED ORDER — ELVITEG-COBIC-EMTRICIT-TENOFAF 150-150-200-10 MG PO TABS: 1.0000 | ORAL_TABLET | Freq: Every day | ORAL | 4 refills | Status: DC

## 2017-01-08 ENCOUNTER — Encounter: Payer: Self-pay | Admitting: Infectious Diseases

## 2017-02-06 ENCOUNTER — Telehealth: Payer: Self-pay | Admitting: *Deleted

## 2017-02-06 NOTE — Telephone Encounter (Signed)
[facility] sent a prior authorization request for patient's genovya to be completed through medicaid. Patient does not have medicaid coverage. RN contacted [facility], asked them to put it through ADAP (he is covered through 05/05/17). Per [facility], ADAP was terminated 01/25/18. Will send patient a message, 'cc Olegario MessierKathy and Morrie Sheldonshley as patient will need patient assistance. Andree CossHowell, Donny Heffern M, RN

## 2017-02-07 ENCOUNTER — Telehealth: Payer: Self-pay | Admitting: *Deleted

## 2017-02-07 NOTE — Telephone Encounter (Signed)
[facility]'s [facility] went through without trouble, no copay due. RN sent patient message to pick it up today, that his ADAP was reinstated. Andree CossHowell, Ariyon Mittleman M, RN

## 2017-03-21 ENCOUNTER — Other Ambulatory Visit: Payer: Self-pay | Admitting: *Deleted

## 2017-03-21 DIAGNOSIS — B20 Human immunodeficiency virus [HIV] disease: Secondary | ICD-10-CM

## 2017-03-25 ENCOUNTER — Other Ambulatory Visit: Payer: Self-pay

## 2017-04-01 ENCOUNTER — Other Ambulatory Visit: Payer: Self-pay

## 2017-04-01 DIAGNOSIS — Z113 Encounter for screening for infections with a predominantly sexual mode of transmission: Secondary | ICD-10-CM

## 2017-04-01 DIAGNOSIS — B20 Human immunodeficiency virus [HIV] disease: Secondary | ICD-10-CM

## 2017-04-01 LAB — CBC WITH DIFFERENTIAL/PLATELET
BASOS ABS: 29 {cells}/uL (ref 0–200)
Basophils Relative: 0.5 %
EOS ABS: 23 {cells}/uL (ref 15–500)
Eosinophils Relative: 0.4 %
HEMATOCRIT: 42.5 % (ref 38.5–50.0)
Hemoglobin: 14.7 g/dL (ref 13.2–17.1)
LYMPHS ABS: 1813 {cells}/uL (ref 850–3900)
MCH: 32 pg (ref 27.0–33.0)
MCHC: 34.6 g/dL (ref 32.0–36.0)
MCV: 92.4 fL (ref 80.0–100.0)
MPV: 12 fL (ref 7.5–12.5)
Monocytes Relative: 4.6 %
NEUTROS ABS: 3574 {cells}/uL (ref 1500–7800)
Neutrophils Relative %: 62.7 %
Platelets: 172 10*3/uL (ref 140–400)
RBC: 4.6 10*6/uL (ref 4.20–5.80)
RDW: 11.1 % (ref 11.0–15.0)
Total Lymphocyte: 31.8 %
WBC: 5.7 10*3/uL (ref 3.8–10.8)
WBCMIX: 262 {cells}/uL (ref 200–950)

## 2017-04-01 LAB — COMPLETE METABOLIC PANEL WITH GFR
AG Ratio: 1.4 (calc) (ref 1.0–2.5)
ALBUMIN MSPROF: 4.2 g/dL (ref 3.6–5.1)
ALT: 21 U/L (ref 9–46)
AST: 34 U/L (ref 10–40)
Alkaline phosphatase (APISO): 53 U/L (ref 40–115)
BUN: 15 mg/dL (ref 7–25)
CALCIUM: 9.7 mg/dL (ref 8.6–10.3)
CO2: 28 mmol/L (ref 20–32)
CREATININE: 0.85 mg/dL (ref 0.60–1.35)
Chloride: 103 mmol/L (ref 98–110)
GFR, EST AFRICAN AMERICAN: 132 mL/min/{1.73_m2} (ref >=60)
GFR, EST NON AFRICAN AMERICAN: 114 mL/min/{1.73_m2} (ref >=60)
GLUCOSE: 107 mg/dL — AB (ref 65–99)
Globulin: 3.1 g/dL (calc) (ref 1.9–3.7)
Potassium: 4.8 mmol/L (ref 3.5–5.3)
Sodium: 137 mmol/L (ref 135–146)
Total Bilirubin: 0.6 mg/dL (ref 0.2–1.2)
Total Protein: 7.3 g/dL (ref 6.1–8.1)

## 2017-04-02 LAB — T-HELPER CELL (CD4) - (RCID CLINIC ONLY)
CD4 T CELL ABS: 450 /uL (ref 400–2700)
CD4 T CELL HELPER: 24 % — AB (ref 33–55)

## 2017-04-02 LAB — URINE CYTOLOGY ANCILLARY ONLY
CHLAMYDIA, DNA PROBE: NEGATIVE
Neisseria Gonorrhea: NEGATIVE

## 2017-04-03 ENCOUNTER — Encounter: Payer: Self-pay | Admitting: Infectious Diseases

## 2017-04-03 LAB — HIV-1 RNA QUANT-NO REFLEX-BLD
HIV 1 RNA Quant: <20 copies/mL
HIV-1 RNA Quant, Log: <1.3 Log copies/mL

## 2017-04-08 ENCOUNTER — Ambulatory Visit: Payer: Self-pay | Admitting: Infectious Diseases

## 2017-04-15 ENCOUNTER — Encounter: Payer: Self-pay | Admitting: Infectious Diseases

## 2017-04-15 ENCOUNTER — Ambulatory Visit (INDEPENDENT_AMBULATORY_CARE_PROVIDER_SITE_OTHER): Payer: Self-pay | Admitting: Infectious Diseases

## 2017-04-15 VITALS — BP 104/64 | HR 65 | Temp 98.1°F | Ht 69.69 in | Wt 143.0 lb

## 2017-04-15 DIAGNOSIS — Z79899 Other long term (current) drug therapy: Secondary | ICD-10-CM

## 2017-04-15 DIAGNOSIS — Z113 Encounter for screening for infections with a predominantly sexual mode of transmission: Secondary | ICD-10-CM

## 2017-04-15 DIAGNOSIS — Z789 Other specified health status: Secondary | ICD-10-CM | POA: Insufficient documentation

## 2017-04-15 DIAGNOSIS — B20 Human immunodeficiency virus [HIV] disease: Secondary | ICD-10-CM

## 2017-04-15 NOTE — Progress Notes (Signed)
   Subjective:    Patient ID: Sean Dodson, male    DOB: 04/23/1980, 37 y.o.   MRN: [id number]  HPI 37yo M with newly dx 02-2014 HIV+. Was born in [place], [place] in [place]. Also dx, tx for schistosomiasis 2016. Ongenvoya.  Seen with interpreter.  Feeling well. No problems with ART, no forgotten doses.   HIV 1 RNA Quant (copies/mL)  Date Value  04/01/2017 <20 NOT DETECTED  08/20/2016 83 (H)  04/23/2016 62 (H)   CD4 T Cell Abs (/uL)  Date Value  04/01/2017 450  08/20/2016 730  04/23/2016 450    Review of Systems  Constitutional: Negative for appetite change and unexpected weight change.  Respiratory: Negative for cough and shortness of breath.   Gastrointestinal: Negative for constipation and diarrhea.  Genitourinary: Negative for difficulty urinating.  Psychiatric/Behavioral: Negative for dysphoric mood and sleep disturbance.       Objective:   Physical Exam  Constitutional: He is oriented to person, place, and time. He appears well-developed and well-nourished.  HENT:  Mouth/Throat: No oropharyngeal exudate.  Eyes: EOM are normal. Pupils are equal, round, and reactive to light.  Neck: Neck supple.  Cardiovascular: Normal rate, regular rhythm and normal heart sounds.  Pulmonary/Chest: Effort normal and breath sounds normal.  Abdominal: Soft. Bowel sounds are normal. There is no tenderness. There is no rebound.  Musculoskeletal: He exhibits no edema.  Lymphadenopathy:    He has no cervical adenopathy.  Neurological: He is alert and oriented to person, place, and time.  Psychiatric: He has a normal mood and affect.       Assessment & Plan:

## 2017-04-15 NOTE — Assessment & Plan Note (Signed)
He is doing very well Will continue his genvoya He has gotten flu shot. Has gotten mening He is given condoms.  Will see him back in 9 months.

## 2017-05-06 ENCOUNTER — Other Ambulatory Visit: Payer: Self-pay | Admitting: Infectious Diseases

## 2017-05-06 DIAGNOSIS — B20 Human immunodeficiency virus [HIV] disease: Secondary | ICD-10-CM

## 2017-05-08 ENCOUNTER — Encounter: Payer: Self-pay | Admitting: Infectious Diseases

## 2017-05-09 ENCOUNTER — Encounter: Payer: Self-pay | Admitting: Infectious Diseases

## 2017-05-13 ENCOUNTER — Encounter: Payer: Self-pay | Admitting: Infectious Diseases

## 2017-08-06 ENCOUNTER — Ambulatory Visit: Payer: Self-pay

## 2017-08-07 ENCOUNTER — Encounter: Payer: Self-pay | Admitting: Infectious Diseases

## 2017-08-31 ENCOUNTER — Other Ambulatory Visit: Payer: Self-pay | Admitting: Infectious Diseases

## 2017-08-31 DIAGNOSIS — B20 Human immunodeficiency virus [HIV] disease: Secondary | ICD-10-CM

## 2017-10-05 ENCOUNTER — Other Ambulatory Visit: Payer: Self-pay | Admitting: Infectious Diseases

## 2017-10-05 DIAGNOSIS — B20 Human immunodeficiency virus [HIV] disease: Secondary | ICD-10-CM

## 2017-10-31 ENCOUNTER — Other Ambulatory Visit: Payer: Self-pay | Admitting: Infectious Diseases

## 2017-10-31 DIAGNOSIS — B20 Human immunodeficiency virus [HIV] disease: Secondary | ICD-10-CM

## 2018-01-07 ENCOUNTER — Other Ambulatory Visit: Payer: Self-pay

## 2018-01-07 DIAGNOSIS — Z79899 Other long term (current) drug therapy: Secondary | ICD-10-CM

## 2018-01-07 DIAGNOSIS — B20 Human immunodeficiency virus [HIV] disease: Secondary | ICD-10-CM

## 2018-01-07 DIAGNOSIS — Z113 Encounter for screening for infections with a predominantly sexual mode of transmission: Secondary | ICD-10-CM

## 2018-01-08 LAB — T-HELPER CELL (CD4) - (RCID CLINIC ONLY)
CD4 T CELL ABS: 530 /uL (ref 400–2700)
CD4 T CELL HELPER: 27 % — AB (ref 33–55)

## 2018-01-09 LAB — COMPREHENSIVE METABOLIC PANEL
AG Ratio: 1.5 (calc) (ref 1.0–2.5)
ALT: 9 U/L (ref 9–46)
AST: 15 U/L (ref 10–40)
Albumin: 4 g/dL (ref 3.6–5.1)
Alkaline phosphatase (APISO): 51 U/L (ref 40–115)
BUN: 10 mg/dL (ref 7–25)
CO2: 30 mmol/L (ref 20–32)
Calcium: 9.5 mg/dL (ref 8.6–10.3)
Chloride: 103 mmol/L (ref 98–110)
Creat: 0.9 mg/dL (ref 0.60–1.35)
Globulin: 2.7 g/dL (calc) (ref 1.9–3.7)
Glucose, Bld: 90 mg/dL (ref 65–99)
Potassium: 4 mmol/L (ref 3.5–5.3)
Sodium: 140 mmol/L (ref 135–146)
Total Bilirubin: 0.4 mg/dL (ref 0.2–1.2)
Total Protein: 6.7 g/dL (ref 6.1–8.1)

## 2018-01-09 LAB — CBC
HEMATOCRIT: 40.5 % (ref 38.5–50.0)
Hemoglobin: 14 g/dL (ref 13.2–17.1)
MCH: 31.9 pg (ref 27.0–33.0)
MCHC: 34.6 g/dL (ref 32.0–36.0)
MCV: 92.3 fL (ref 80.0–100.0)
MPV: 11.9 fL (ref 7.5–12.5)
Platelets: 152 10*3/uL (ref 140–400)
RBC: 4.39 10*6/uL (ref 4.20–5.80)
RDW: 11.1 % (ref 11.0–15.0)
WBC: 3.4 10*3/uL — ABNORMAL LOW (ref 3.8–10.8)

## 2018-01-09 LAB — LIPID PANEL
Cholesterol: 220 mg/dL — ABNORMAL HIGH (ref <200)
HDL: 52 mg/dL (ref >40)
LDL CHOLESTEROL (CALC): 141 mg/dL — AB
Non-HDL Cholesterol (Calc): 168 mg/dL (calc) — ABNORMAL HIGH (ref <130)
Total CHOL/HDL Ratio: 4.2 (calc) (ref <5.0)
Triglycerides: 142 mg/dL (ref <150)

## 2018-01-09 LAB — RPR: RPR Ser Ql: NONREACTIVE

## 2018-01-09 LAB — HIV-1 RNA QUANT-NO REFLEX-BLD
HIV 1 RNA Quant: 23 copies/mL — ABNORMAL HIGH
HIV-1 RNA Quant, Log: 1.36 Log copies/mL — ABNORMAL HIGH

## 2018-01-21 ENCOUNTER — Ambulatory Visit: Payer: Self-pay | Admitting: Infectious Diseases

## 2018-01-22 ENCOUNTER — Ambulatory Visit: Payer: Self-pay | Admitting: Infectious Diseases

## 2018-01-24 ENCOUNTER — Encounter: Payer: Self-pay | Admitting: Infectious Diseases

## 2018-01-24 ENCOUNTER — Ambulatory Visit (INDEPENDENT_AMBULATORY_CARE_PROVIDER_SITE_OTHER): Payer: Self-pay | Admitting: Infectious Diseases

## 2018-01-24 VITALS — BP 121/74 | HR 79 | Temp 98.6°F | Wt 148.0 lb

## 2018-01-24 DIAGNOSIS — B20 Human immunodeficiency virus [HIV] disease: Secondary | ICD-10-CM

## 2018-01-24 DIAGNOSIS — Z79899 Other long term (current) drug therapy: Secondary | ICD-10-CM

## 2018-01-24 DIAGNOSIS — Z113 Encounter for screening for infections with a predominantly sexual mode of transmission: Secondary | ICD-10-CM

## 2018-01-24 DIAGNOSIS — Z23 Encounter for immunization: Secondary | ICD-10-CM

## 2018-01-24 NOTE — Progress Notes (Signed)
   Subjective:    Patient ID: Sean AgeeSerge Deeb, male    DOB: 08/18/1980, 37 y.o.   MRN: [id number]  HPI [age]yo M with newly dx 02-2014 HIV+. Was born in [place], [place] in [place]. Also dx, tx for schistosomiasis 2016. Ongenvoya.  Has been feeling well. No problems with medications. No missed doses.   HIV 1 RNA Quant (copies/mL)  Date Value  01/07/2018 23 (H)  04/01/2017 <20 NOT DETECTED  08/20/2016 83 (H)   CD4 T Cell Abs (/uL)  Date Value  01/07/2018 530  04/01/2017 450  08/20/2016 730    Review of Systems  Constitutional: Negative for appetite change and unexpected weight change.  Respiratory: Negative for shortness of breath.   Gastrointestinal: Negative for constipation and diarrhea.  Genitourinary: Negative for difficulty urinating.  Neurological: Negative for light-headedness.  Psychiatric/Behavioral: Negative for sleep disturbance.  Please see HPI. All other systems reviewed and negative.      Objective:   Physical Exam Constitutional:      Appearance: Normal appearance. He is not ill-appearing.  HENT:     Mouth/Throat:     Mouth: Mucous membranes are moist.     Pharynx: No oropharyngeal exudate.  Eyes:     Extraocular Movements: Extraocular movements intact.     Pupils: Pupils are equal, round, and reactive to light.  Neck:     Musculoskeletal: Normal range of motion and neck supple.  Cardiovascular:     Rate and Rhythm: Normal rate and regular rhythm.  Pulmonary:     Effort: Pulmonary effort is normal.     Breath sounds: Normal breath sounds.  Abdominal:     General: Bowel sounds are normal. There is no distension.     Palpations: Abdomen is soft.     Tenderness: There is no abdominal tenderness.  Musculoskeletal: Normal range of motion.  Neurological:     General: No focal deficit present.     Mental Status: He is alert.  Psychiatric:        Mood and Affect: Mood normal.       Assessment & Plan:

## 2018-01-24 NOTE — Assessment & Plan Note (Signed)
He is doing well, blip Flu shot today Given condoms- no current partner though.  He is seen with video interpreter.  rtc in 9 months

## 2018-02-12 ENCOUNTER — Other Ambulatory Visit: Payer: Self-pay | Admitting: Infectious Diseases

## 2018-02-12 DIAGNOSIS — B20 Human immunodeficiency virus [HIV] disease: Secondary | ICD-10-CM

## 2018-03-18 ENCOUNTER — Other Ambulatory Visit: Payer: Self-pay | Admitting: *Deleted

## 2018-03-19 ENCOUNTER — Telehealth: Payer: Self-pay | Admitting: *Deleted

## 2018-03-19 NOTE — Telephone Encounter (Signed)
Good morning   my last refill was 01/15/2018. and I see in my account that I have 6 refill but I have not received anything yet. I called [facility] to ship my refill to [place] and I paid $ 4.99 on the [employer] but got nothing. I went to [facility] on Thursday and the pharmacist told me that my refill is on the way. I don't understand, because my credit card wasn't charged $ 4.99. and for 2 weeks I have been requesting a refill but I haven't received anything yet. I do not know why.  ----- Message -----  From: Nurse Ritta Slot  Sent: 03/18/18, 2:44 PM  To: Janeece Agee  Subject: RE: Appointment Request    How can I help you?    We sent a 6 month supply of Genv[facility] to [facility] [address] 2 weeks ago. Are you having trouble getting your medicine?  Marcelino Duster      ----- Message -----   From: Janeece Agee   Sent: 03/11/2018 4:46 PM EST    To: Patient Appointment Schedule Request Message List  Subject: RE: Appointment Request    Appointment Request From: Janeece Agee    With Provider: Johny Sax, MD [facility] for Infectious Disease]    Preferred Date Range: Any date 03/26/2018 or earlier    Preferred Times: Any time    Reason for visit: Request an Appointment    Comments:  Refill status

## 2018-03-25 ENCOUNTER — Encounter: Payer: Self-pay | Admitting: *Deleted

## 2018-09-25 ENCOUNTER — Telehealth: Payer: Self-pay | Admitting: Pharmacy Technician

## 2018-09-25 NOTE — Telephone Encounter (Signed)
[facility] Patient Advocate Encounter   Patient has been approved for [employer] Advancing Access Patient Assistance Program for [place] for 30 days. This assistance will make the patient's copay $0.  The billing information is as follows and has been shared with the patient via email through Robert Bellow. While pending UMAP we can submit the full application if needed for further coverage.  Member ID: [id number] RxBin: [id number] PCN: [id number] Group: [id number]  Patient knows to call the office with questions or concerns.  Bartholomew Crews, CPhT Specialty Pharmacy Patient [facility] for Infectious Disease Phone: [phone] Fax: [phone] 09/25/2018 2:06 PM

## 2018-10-28 ENCOUNTER — Other Ambulatory Visit: Payer: Self-pay

## 2018-10-29 ENCOUNTER — Other Ambulatory Visit: Payer: Self-pay

## 2018-10-29 ENCOUNTER — Other Ambulatory Visit: Payer: Self-pay | Admitting: Infectious Diseases

## 2018-10-29 ENCOUNTER — Encounter: Payer: Self-pay | Admitting: Infectious Diseases

## 2018-10-29 ENCOUNTER — Ambulatory Visit: Payer: Self-pay

## 2018-10-29 ENCOUNTER — Other Ambulatory Visit: Payer: Self-pay | Admitting: Infectious Disease

## 2018-10-29 ENCOUNTER — Telehealth: Payer: Self-pay | Admitting: Pharmacy Technician

## 2018-10-29 DIAGNOSIS — Z79899 Other long term (current) drug therapy: Secondary | ICD-10-CM

## 2018-10-29 DIAGNOSIS — Z113 Encounter for screening for infections with a predominantly sexual mode of transmission: Secondary | ICD-10-CM

## 2018-10-29 DIAGNOSIS — B20 Human immunodeficiency virus [HIV] disease: Secondary | ICD-10-CM

## 2018-10-29 NOTE — Addendum Note (Signed)
Addended by: Dolan Amen D on: 10/29/2018 02:28 PM   Modules accepted: Orders

## 2018-10-29 NOTE — Telephone Encounter (Addendum)
[facility] Patient Advocate Encounter  Completed and sent [employer] Advancing Access application for Genvoya for this patient who is uninsured.    Patient is approved 10/29/2018 through 02/05/2019.    BIN      [id number] PCN    [id number] GRP    [id number] ID        [id number]  He moved to [facility] and has been traveling to [place] to pick up his medication (the specialty pharmacy will not mail to a PO Box.  He will have permanent housing eventually and will set up mail order then).  I called the ADAP pharmacy in [facility] [place] [facility] (the closest one to him) and they filled there so his travel will be less.  He speaks only [place] and asked for a text when the medication is ready and the pharmacy agreed and set it up that way for him.   Venida Jarvis. Nadara Mustard [place] Patient [facility] for Infectious Disease Phone: [phone] Fax:  [phone]

## 2018-10-30 ENCOUNTER — Telehealth: Payer: Self-pay

## 2018-10-30 NOTE — Telephone Encounter (Signed)
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 Was able to relay results with the assistance from french interpreter, patient is currently scheduled for Monday 9/28 for injection. Patient understands he will need to notify recent partners to get tested/treated at health department, and must refrain from sexual activity for at least two weeks after treatment. [place]

## 2018-10-30 NOTE — Telephone Encounter (Signed)
-----   Message from [place] Riches, MD sent at 10/30/2018  1:29 PM EDT ----- Pt needs IM benzathine Pen G 2.4 million units x 1 thanks ----- Message ----- From: Cheyenne Adas Lab Results In Sent: 10/29/2018   3:21 PM EDT To: [place] Riches, MD

## 2018-10-31 LAB — CBC
HCT: 38.4 % — ABNORMAL LOW (ref 38.5–50.0)
Hemoglobin: 12.9 g/dL — ABNORMAL LOW (ref 13.2–17.1)
MCH: 30.8 pg (ref 27.0–33.0)
MCHC: 33.6 g/dL (ref 32.0–36.0)
MCV: 91.6 fL (ref 80.0–100.0)
MPV: 12.5 fL (ref 7.5–12.5)
Platelets: 166 10*3/uL (ref 140–400)
RBC: 4.19 10*6/uL — ABNORMAL LOW (ref 4.20–5.80)
RDW: 11.2 % (ref 11.0–15.0)
WBC: 4 10*3/uL (ref 3.8–10.8)

## 2018-10-31 LAB — RPR TITER: RPR Titer: 1:32 {titer} — ABNORMAL HIGH

## 2018-10-31 LAB — HELPER T-LYMPH-CD4 (ARMC ONLY)
% CD 4 Pos. Lymph.: 26.7 % — ABNORMAL LOW (ref 30.8–58.5)
Absolute CD 4 Helper: 481 /uL (ref 359–1519)
Basophils Absolute: 0 10*3/uL (ref 0.0–0.2)
Basos: 1 %
EOS (ABSOLUTE): 0 10*3/uL (ref 0.0–0.4)
Eos: 1 %
Hematocrit: 40.5 % (ref 37.5–51.0)
Hemoglobin: 13.2 g/dL (ref 13.0–17.7)
Immature Grans (Abs): 0 10*3/uL (ref 0.0–0.1)
Immature Granulocytes: 0 %
Lymphocytes Absolute: 1.8 10*3/uL (ref 0.7–3.1)
Lymphs: 44 %
MCH: 30.9 pg (ref 26.6–33.0)
MCHC: 32.6 g/dL (ref 31.5–35.7)
MCV: 95 fL (ref 79–97)
Monocytes Absolute: 0.4 10*3/uL (ref 0.1–0.9)
Monocytes: 10 %
Neutrophils Absolute: 1.7 10*3/uL (ref 1.4–7.0)
Neutrophils: 44 %
Platelets: 165 10*3/uL (ref 150–450)
RBC: 4.27 x10E6/uL (ref 4.14–5.80)
RDW: 11.5 % — ABNORMAL LOW (ref 11.6–15.4)
WBC: 3.9 10*3/uL (ref 3.4–10.8)

## 2018-10-31 LAB — COMPREHENSIVE METABOLIC PANEL
AG Ratio: 1.1 (calc) (ref 1.0–2.5)
ALT: 19 U/L (ref 9–46)
AST: 19 U/L (ref 10–40)
Albumin: 4 g/dL (ref 3.6–5.1)
Alkaline phosphatase (APISO): 56 U/L (ref 36–130)
BUN: 13 mg/dL (ref 7–25)
CO2: 32 mmol/L (ref 20–32)
Calcium: 9.3 mg/dL (ref 8.6–10.3)
Chloride: 104 mmol/L (ref 98–110)
Creat: 0.84 mg/dL (ref 0.60–1.35)
Globulin: 3.6 g/dL (calc) (ref 1.9–3.7)
Glucose, Bld: 95 mg/dL (ref 65–99)
Potassium: 4.3 mmol/L (ref 3.5–5.3)
Sodium: 137 mmol/L (ref 135–146)
Total Bilirubin: 0.5 mg/dL (ref 0.2–1.2)
Total Protein: 7.6 g/dL (ref 6.1–8.1)

## 2018-10-31 LAB — LIPID PANEL
Cholesterol: 161 mg/dL (ref <200)
HDL: 48 mg/dL (ref >=40)
LDL Cholesterol (Calc): 95 mg/dL (calc)
Non-HDL Cholesterol (Calc): 113 mg/dL (calc) (ref <130)
Total CHOL/HDL Ratio: 3.4 (calc) (ref <5.0)
Triglycerides: 89 mg/dL (ref <150)

## 2018-10-31 LAB — RPR: RPR Ser Ql: REACTIVE — AB

## 2018-10-31 LAB — FLUORESCENT TREPONEMAL AB(FTA)-IGG-BLD: Fluorescent Treponemal ABS: REACTIVE — AB

## 2018-10-31 LAB — URINE CYTOLOGY ANCILLARY ONLY
Chlamydia: NEGATIVE
Neisseria Gonorrhea: NEGATIVE

## 2018-10-31 LAB — HIV-1 RNA QUANT-NO REFLEX-BLD
HIV 1 RNA Quant: 28800 copies/mL — ABNORMAL HIGH
HIV-1 RNA Quant, Log: 4.46 Log copies/mL — ABNORMAL HIGH

## 2018-11-01 ENCOUNTER — Other Ambulatory Visit: Payer: Self-pay | Admitting: Infectious Disease

## 2018-11-01 DIAGNOSIS — B20 Human immunodeficiency virus [HIV] disease: Secondary | ICD-10-CM

## 2018-11-03 ENCOUNTER — Other Ambulatory Visit: Payer: Self-pay

## 2018-11-03 ENCOUNTER — Ambulatory Visit (INDEPENDENT_AMBULATORY_CARE_PROVIDER_SITE_OTHER): Payer: Self-pay

## 2018-11-03 DIAGNOSIS — B20 Human immunodeficiency virus [HIV] disease: Secondary | ICD-10-CM

## 2018-11-03 DIAGNOSIS — A64 Unspecified sexually transmitted disease: Secondary | ICD-10-CM

## 2018-11-03 MED ORDER — PENICILLIN G BENZATHINE 1200000 UNIT/2ML IM SUSP
1.2000 10*6.[IU] | Freq: Once | INTRAMUSCULAR | Status: AC
  Administered 2018-11-03: 17:00:00 1.2 10*6.[IU] via INTRAMUSCULAR

## 2018-11-03 MED ORDER — PENICILLIN G BENZATHINE 1200000 UNIT/2ML IM SUSP
1.2000 10*6.[IU] | Freq: Once | INTRAMUSCULAR | Status: AC
  Administered 2018-11-03: 1.2 10*6.[IU] via INTRAMUSCULAR

## 2018-11-03 NOTE — Progress Notes (Signed)
Patient in office today for syphilis treatment. Per Dr. Johnnye Sima patient needs Pt needs IM benzathine Pen G 2.4 million units x 1.  Chart reviewed. No penicillins allergies listed and confirmed with patient. Condom offered and accepted.   Medication administered with chaperone present. Patient tolerated well. Patient remained in the office to observe for any adverse reactions/none observed.  Eugenia Mcalpine

## 2018-11-14 ENCOUNTER — Ambulatory Visit (INDEPENDENT_AMBULATORY_CARE_PROVIDER_SITE_OTHER): Payer: Self-pay | Admitting: Infectious Diseases

## 2018-11-14 ENCOUNTER — Encounter: Payer: Self-pay | Admitting: Infectious Diseases

## 2018-11-14 ENCOUNTER — Other Ambulatory Visit: Payer: Self-pay

## 2018-11-14 VITALS — BP 117/77 | HR 60 | Temp 98.2°F

## 2018-11-14 DIAGNOSIS — Z79899 Other long term (current) drug therapy: Secondary | ICD-10-CM

## 2018-11-14 DIAGNOSIS — Z113 Encounter for screening for infections with a predominantly sexual mode of transmission: Secondary | ICD-10-CM

## 2018-11-14 DIAGNOSIS — Z23 Encounter for immunization: Secondary | ICD-10-CM

## 2018-11-14 DIAGNOSIS — B20 Human immunodeficiency virus [HIV] disease: Secondary | ICD-10-CM

## 2018-11-14 DIAGNOSIS — A539 Syphilis, unspecified: Secondary | ICD-10-CM

## 2018-11-14 NOTE — Addendum Note (Signed)
Addended by: Carlean Purl on: 11/14/2018 11:42 AM   Modules accepted: Orders

## 2018-11-14 NOTE — Assessment & Plan Note (Signed)
Pt states he was rx ~ 1 week ago.  Will check f/u titer at next visit.

## 2018-11-14 NOTE — Assessment & Plan Note (Signed)
He is given flu, PCV 13, mening #2.  Offered condoms He is resuming art He does not want to change to [place] in [place], has family here and like to come here.  Will see him back in 9 months.

## 2018-11-14 NOTE — Progress Notes (Signed)
   Subjective:    Patient ID: Sean Dodson, male    DOB: 17-Mar-1980, 38 y.o.   MRN: [id number]  HPI 38yo M with newly dx 02-2014 HIV+. Was born in [place], came to [place] in end of 2015. Also dx, tx for schistosomiasis 2016. Ongenvoya previously. Took drug holiday for 2 months because he moved. He is currently living in [place].  Seen with french interpreter.  Has been feeling well.  Denies difficulty with ART.   HIV 1 RNA Quant (copies/mL)  Date Value  10/29/2018 28,800 (H)  01/07/2018 23 (H)  04/01/2017 <20 NOT DETECTED   CD4 T Cell Abs (/uL)  Date Value  01/07/2018 530  04/01/2017 450  08/20/2016 730    Review of Systems  Constitutional: Negative for appetite change, chills, fever and unexpected weight change.  Gastrointestinal: Negative for constipation and diarrhea.  Genitourinary: Negative for difficulty urinating.  Psychiatric/Behavioral: Negative for sleep disturbance.  Please see HPI. All other systems reviewed and negative.      Objective:   Physical Exam Constitutional:      Appearance: Normal appearance.  HENT:     Mouth/Throat:     Mouth: Mucous membranes are moist.     Pharynx: No oropharyngeal exudate.  Eyes:     Extraocular Movements: Extraocular movements intact.     Pupils: Pupils are equal, round, and reactive to light.  Neck:     Musculoskeletal: Normal range of motion and neck supple.  Cardiovascular:     Rate and Rhythm: Normal rate and regular rhythm.  Pulmonary:     Effort: Pulmonary effort is normal.     Breath sounds: Normal breath sounds.  Abdominal:     General: Bowel sounds are normal. There is no distension.     Palpations: Abdomen is soft.     Tenderness: There is no abdominal tenderness.  Musculoskeletal:     Right lower leg: No edema.     Left lower leg: No edema.  Neurological:     Mental Status: He is alert.  Psychiatric:        Mood and Affect: Mood normal.       Assessment & Plan:

## 2018-11-18 ENCOUNTER — Ambulatory Visit: Payer: Self-pay | Admitting: Infectious Diseases

## 2018-11-20 LAB — HIV-1 GENOTYPING (RTI,PI,IN INHBTR): HIV-1 Genotype: DETECTED — AB

## 2018-12-18 ENCOUNTER — Encounter: Payer: Self-pay | Admitting: Infectious Diseases

## 2019-01-03 ENCOUNTER — Other Ambulatory Visit: Payer: Self-pay | Admitting: Infectious Diseases

## 2019-01-03 DIAGNOSIS — B20 Human immunodeficiency virus [HIV] disease: Secondary | ICD-10-CM

## 2019-01-21 ENCOUNTER — Other Ambulatory Visit: Payer: Self-pay | Admitting: *Deleted

## 2019-01-21 DIAGNOSIS — B20 Human immunodeficiency virus [HIV] disease: Secondary | ICD-10-CM

## 2019-01-21 MED ORDER — GENVOYA 150-150-200-10 MG PO TABS: 1.0000 | ORAL_TABLET | Freq: Every day | ORAL | 5 refills | Status: DC

## 2019-01-21 NOTE — Progress Notes (Signed)
RN sent genvoya refill to [facility] in [place], [place].  The [facility] [address] in [place] who requested the refill 11/28 does not participate in ADAP and they used a [employer] card to pay for his medication, wiping out his eligibility for this copay program for the next year. If he does get insurance this year, he will potentially need help finding alternative copay programs. Landis Gandy, RN

## 2019-02-13 ENCOUNTER — Ambulatory Visit: Payer: Self-pay

## 2019-02-17 NOTE — Telephone Encounter (Signed)
Thank you :)

## 2019-02-25 ENCOUNTER — Ambulatory Visit: Payer: Self-pay

## 2019-04-13 ENCOUNTER — Ambulatory Visit: Payer: Self-pay

## 2019-04-20 ENCOUNTER — Other Ambulatory Visit: Payer: Self-pay

## 2019-04-20 ENCOUNTER — Ambulatory Visit: Payer: Self-pay

## 2019-04-20 ENCOUNTER — Encounter: Payer: Self-pay | Admitting: Infectious Diseases

## 2019-08-10 ENCOUNTER — Other Ambulatory Visit: Payer: Self-pay | Admitting: Infectious Diseases

## 2019-08-10 DIAGNOSIS — B20 Human immunodeficiency virus [HIV] disease: Secondary | ICD-10-CM

## 2019-08-14 ENCOUNTER — Ambulatory Visit: Payer: Self-pay

## 2019-08-14 ENCOUNTER — Other Ambulatory Visit: Payer: Self-pay

## 2019-08-28 ENCOUNTER — Other Ambulatory Visit: Payer: Self-pay

## 2019-08-28 ENCOUNTER — Ambulatory Visit: Payer: Self-pay

## 2019-08-28 ENCOUNTER — Encounter: Payer: Self-pay | Admitting: Infectious Diseases

## 2019-08-28 DIAGNOSIS — Z113 Encounter for screening for infections with a predominantly sexual mode of transmission: Secondary | ICD-10-CM

## 2019-08-28 DIAGNOSIS — B20 Human immunodeficiency virus [HIV] disease: Secondary | ICD-10-CM

## 2019-08-28 DIAGNOSIS — A539 Syphilis, unspecified: Secondary | ICD-10-CM

## 2019-08-28 DIAGNOSIS — Z79899 Other long term (current) drug therapy: Secondary | ICD-10-CM

## 2019-08-28 LAB — T-HELPER CELL (CD4) - (RCID CLINIC ONLY)
CD4 % Helper T Cell: 30 % — ABNORMAL LOW (ref 33–65)
CD4 T Cell Abs: 524 /uL (ref 400–1790)

## 2019-08-31 LAB — URINE CYTOLOGY ANCILLARY ONLY
Chlamydia: NEGATIVE
Comment: NEGATIVE
Comment: NORMAL
Neisseria Gonorrhea: NEGATIVE

## 2019-09-01 ENCOUNTER — Encounter: Payer: Self-pay | Admitting: Infectious Diseases

## 2019-09-03 LAB — COMPREHENSIVE METABOLIC PANEL
AG Ratio: 1.5 (calc) (ref 1.0–2.5)
ALT: 31 U/L (ref 9–46)
AST: 33 U/L (ref 10–40)
Albumin: 4.1 g/dL (ref 3.6–5.1)
Alkaline phosphatase (APISO): 63 U/L (ref 36–130)
BUN: 19 mg/dL (ref 7–25)
CO2: 31 mmol/L (ref 20–32)
Calcium: 9.4 mg/dL (ref 8.6–10.3)
Chloride: 101 mmol/L (ref 98–110)
Creat: 0.92 mg/dL (ref 0.60–1.35)
Globulin: 2.7 g/dL (calc) (ref 1.9–3.7)
Glucose, Bld: 104 mg/dL — ABNORMAL HIGH (ref 65–99)
Potassium: 4.3 mmol/L (ref 3.5–5.3)
Sodium: 137 mmol/L (ref 135–146)
Total Bilirubin: 0.7 mg/dL (ref 0.2–1.2)
Total Protein: 6.8 g/dL (ref 6.1–8.1)

## 2019-09-03 LAB — HIV-1 RNA QUANT-NO REFLEX-BLD
HIV 1 RNA Quant: <20 copies/mL — AB
HIV-1 RNA Quant, Log: <1.3 Log copies/mL — AB

## 2019-09-03 LAB — CBC
HCT: 40.2 % (ref 38.5–50.0)
Hemoglobin: 13.5 g/dL (ref 13.2–17.1)
MCH: 32.1 pg (ref 27.0–33.0)
MCHC: 33.6 g/dL (ref 32.0–36.0)
MCV: 95.5 fL (ref 80.0–100.0)
MPV: 11.3 fL (ref 7.5–12.5)
Platelets: 173 10*3/uL (ref 140–400)
RBC: 4.21 10*6/uL (ref 4.20–5.80)
RDW: 10.9 % — ABNORMAL LOW (ref 11.0–15.0)
WBC: 2.8 10*3/uL — ABNORMAL LOW (ref 3.8–10.8)

## 2019-09-03 LAB — LIPID PANEL
Cholesterol: 210 mg/dL — ABNORMAL HIGH (ref <200)
HDL: 80 mg/dL (ref >=40)
LDL Cholesterol (Calc): 111 mg/dL (calc) — ABNORMAL HIGH
Non-HDL Cholesterol (Calc): 130 mg/dL (calc) — ABNORMAL HIGH (ref <130)
Total CHOL/HDL Ratio: 2.6 (calc) (ref <5.0)
Triglycerides: 91 mg/dL (ref <150)

## 2019-09-03 LAB — RPR TITER: RPR Titer: 1:1 {titer} — ABNORMAL HIGH

## 2019-09-03 LAB — FLUORESCENT TREPONEMAL AB(FTA)-IGG-BLD: Fluorescent Treponemal ABS: REACTIVE — AB

## 2019-09-03 LAB — RPR: RPR Ser Ql: REACTIVE — AB

## 2019-09-08 NOTE — Telephone Encounter (Signed)
Lab results consistent with treated syphilis.  Not current or active.  thanks

## 2019-09-11 ENCOUNTER — Encounter: Payer: Self-pay | Admitting: Infectious Diseases

## 2019-09-11 ENCOUNTER — Other Ambulatory Visit: Payer: Self-pay

## 2019-09-11 ENCOUNTER — Ambulatory Visit (INDEPENDENT_AMBULATORY_CARE_PROVIDER_SITE_OTHER): Payer: Self-pay | Admitting: Infectious Diseases

## 2019-09-11 VITALS — BP 114/71 | HR 54 | Temp 98.0°F | Wt 144.0 lb

## 2019-09-11 DIAGNOSIS — B20 Human immunodeficiency virus [HIV] disease: Secondary | ICD-10-CM

## 2019-09-11 DIAGNOSIS — Z113 Encounter for screening for infections with a predominantly sexual mode of transmission: Secondary | ICD-10-CM

## 2019-09-11 DIAGNOSIS — Z79899 Other long term (current) drug therapy: Secondary | ICD-10-CM

## 2019-09-11 NOTE — Assessment & Plan Note (Signed)
He is doing well Given condoms Encouraged to get COVID vax Offered food (does not need/want) Reviewed labs with him.  PCV 23 2025 Will see him back in 9 months with labs prior.

## 2019-09-11 NOTE — Progress Notes (Signed)
   Subjective:    Patient ID: Sean Dodson, male    DOB: [birth year], 39 y.o.   MRN: [id number]  HPI 39yo M with newly dx 02-2014 HIV+. Was born in [place], came to [place] in end of [place]. Also dx, tx for schistosomiasis 2016. Ongenvoya- no problems with this.   Has not gotten COVID vax. "I'm not ready yet".   No partners.  Working for [employer].   HIV 1 RNA Quant (copies/mL)  Date Value  08/28/2019 <20 DETECTED (A)  10/29/2018 28,800 (H)  01/07/2018 23 (H)   CD4 T Cell Abs (/uL)  Date Value  08/28/2019 524  01/07/2018 530  04/01/2017 450    Review of Systems  Constitutional: Negative for appetite change, chills, fever and unexpected weight change.  Respiratory: Negative for shortness of breath and stridor.   Gastrointestinal: Negative for constipation and diarrhea.  Genitourinary: Negative for difficulty urinating.  Psychiatric/Behavioral: Negative for sleep disturbance.       Objective:   Physical Exam Vitals reviewed.  Constitutional:      Appearance: Normal appearance.  HENT:     Mouth/Throat:     Mouth: Mucous membranes are moist.     Pharynx: No oropharyngeal exudate.  Eyes:     Extraocular Movements: Extraocular movements intact.     Pupils: Pupils are equal, round, and reactive to light.  Cardiovascular:     Rate and Rhythm: Normal rate and regular rhythm.  Pulmonary:     Effort: Pulmonary effort is normal.     Breath sounds: Normal breath sounds.  Abdominal:     General: Bowel sounds are normal. There is no distension.     Palpations: Abdomen is soft.     Tenderness: There is no abdominal tenderness.  Musculoskeletal:        General: Normal range of motion.     Cervical back: Normal range of motion and neck supple.     Right lower leg: No edema.     Left lower leg: No edema.  Neurological:     General: No focal deficit present.     Mental Status: He is alert.  Psychiatric:        Mood and Affect: Mood normal.           Assessment & Plan:

## 2019-10-20 ENCOUNTER — Other Ambulatory Visit: Payer: Self-pay | Admitting: Infectious Diseases

## 2019-10-20 DIAGNOSIS — B20 Human immunodeficiency virus [HIV] disease: Secondary | ICD-10-CM

## 2020-04-05 ENCOUNTER — Other Ambulatory Visit: Payer: Self-pay | Admitting: Infectious Diseases

## 2020-04-05 DIAGNOSIS — B20 Human immunodeficiency virus [HIV] disease: Secondary | ICD-10-CM

## 2020-05-27 ENCOUNTER — Other Ambulatory Visit: Payer: Self-pay

## 2020-05-27 ENCOUNTER — Ambulatory Visit: Payer: Self-pay

## 2020-05-27 ENCOUNTER — Encounter: Payer: Self-pay | Admitting: Infectious Diseases

## 2020-05-27 DIAGNOSIS — B20 Human immunodeficiency virus [HIV] disease: Secondary | ICD-10-CM

## 2020-05-27 DIAGNOSIS — Z79899 Other long term (current) drug therapy: Secondary | ICD-10-CM

## 2020-05-27 DIAGNOSIS — Z113 Encounter for screening for infections with a predominantly sexual mode of transmission: Secondary | ICD-10-CM

## 2020-05-30 LAB — URINE CYTOLOGY ANCILLARY ONLY
Chlamydia: NEGATIVE
Comment: NEGATIVE
Comment: NORMAL
Neisseria Gonorrhea: NEGATIVE

## 2020-05-31 LAB — LIPID PANEL
Cholesterol: 199 mg/dL (ref <200)
HDL: 65 mg/dL (ref >=40)
LDL Cholesterol (Calc): 109 mg/dL (calc) — ABNORMAL HIGH
Non-HDL Cholesterol (Calc): 134 mg/dL (calc) — ABNORMAL HIGH (ref <130)
Total CHOL/HDL Ratio: 3.1 (calc) (ref <5.0)
Triglycerides: 136 mg/dL (ref <150)

## 2020-05-31 LAB — COMPREHENSIVE METABOLIC PANEL
AG Ratio: 1.5 (calc) (ref 1.0–2.5)
ALT: 22 U/L (ref 9–46)
AST: 31 U/L (ref 10–40)
Albumin: 3.9 g/dL (ref 3.6–5.1)
Alkaline phosphatase (APISO): 61 U/L (ref 36–130)
BUN: 13 mg/dL (ref 7–25)
CO2: 30 mmol/L (ref 20–32)
Calcium: 9.2 mg/dL (ref 8.6–10.3)
Chloride: 102 mmol/L (ref 98–110)
Creat: 1.04 mg/dL (ref 0.60–1.35)
Globulin: 2.6 g/dL (calc) (ref 1.9–3.7)
Glucose, Bld: 90 mg/dL (ref 65–99)
Potassium: 4.4 mmol/L (ref 3.5–5.3)
Sodium: 137 mmol/L (ref 135–146)
Total Bilirubin: 0.4 mg/dL (ref 0.2–1.2)
Total Protein: 6.5 g/dL (ref 6.1–8.1)

## 2020-05-31 LAB — CBC
HCT: 41.8 % (ref 38.5–50.0)
Hemoglobin: 13.9 g/dL (ref 13.2–17.1)
MCH: 31.4 pg (ref 27.0–33.0)
MCHC: 33.3 g/dL (ref 32.0–36.0)
MCV: 94.6 fL (ref 80.0–100.0)
MPV: 11.3 fL (ref 7.5–12.5)
Platelets: 166 10*3/uL (ref 140–400)
RBC: 4.42 10*6/uL (ref 4.20–5.80)
RDW: 10.7 % — ABNORMAL LOW (ref 11.0–15.0)
WBC: 4.7 10*3/uL (ref 3.8–10.8)

## 2020-05-31 LAB — HIV-1 RNA QUANT-NO REFLEX-BLD
HIV 1 RNA Quant: <20 Copies/mL — ABNORMAL HIGH
HIV-1 RNA Quant, Log: <1.3 Log cps/mL — ABNORMAL HIGH

## 2020-05-31 LAB — T-HELPER CELLS (CD4) COUNT (NOT AT ARMC)
Absolute CD4: 889 cells/uL (ref 490–1740)
CD4 T Helper %: 31 % (ref 30–61)
Total lymphocyte count: 2849 cells/uL (ref 850–3900)

## 2020-05-31 LAB — RPR: RPR Ser Ql: REACTIVE — AB

## 2020-05-31 LAB — RPR TITER: RPR Titer: 1:16 {titer} — ABNORMAL HIGH

## 2020-05-31 LAB — FLUORESCENT TREPONEMAL AB(FTA)-IGG-BLD: Fluorescent Treponemal ABS: REACTIVE — AB

## 2020-05-31 NOTE — Progress Notes (Signed)
Patient called regarding results, no answer and staff did not leave a voicemail due to unsecured status. Will send patient myChart message to contact our office for lab results and treatment plan.

## 2020-06-01 ENCOUNTER — Telehealth: Payer: Self-pay

## 2020-06-01 NOTE — Telephone Encounter (Signed)
Called patient via [employer] [facility] ID# [phone]) to relay positive syphilis results per Dr. Ninetta Lights. Per chart, patient has received penicillin in the past. RN advised patient to notify partners so that they can be tested and treated, advised no sex until treatment is completed plus an additional 10 days. Scheduled for 06/03/20 nurse visit. Patient verbalized understanding and has no further questions.   Sandie Ano, RN

## 2020-06-01 NOTE — Telephone Encounter (Signed)
-----   Message from Ginnie Smart, MD sent at 05/31/2020 11:55 AM EDT ----- Pt and partner need benzathine Pen G IM 2.4 million units IM x 1 Thanks  ----- Message ----- From: Janace Hoard Lab Results In Sent: 05/27/2020   3:09 PM EDT To: Ginnie Smart, MD

## 2020-06-02 ENCOUNTER — Other Ambulatory Visit: Payer: Self-pay | Admitting: Infectious Diseases

## 2020-06-02 DIAGNOSIS — B20 Human immunodeficiency virus [HIV] disease: Secondary | ICD-10-CM

## 2020-06-03 ENCOUNTER — Other Ambulatory Visit: Payer: Self-pay

## 2020-06-03 ENCOUNTER — Ambulatory Visit (INDEPENDENT_AMBULATORY_CARE_PROVIDER_SITE_OTHER): Payer: Self-pay

## 2020-06-03 DIAGNOSIS — A539 Syphilis, unspecified: Secondary | ICD-10-CM

## 2020-06-03 MED ORDER — PENICILLIN G BENZATHINE 1200000 UNIT/2ML IM SUSY
1.2000 10*6.[IU] | PREFILLED_SYRINGE | Freq: Once | INTRAMUSCULAR | Status: AC
  Administered 2020-06-03: 1.2 10*6.[IU] via INTRAMUSCULAR

## 2020-06-03 NOTE — Progress Notes (Addendum)
Reviewed and verified allergies with patient. Patient tolerated Bicillin injections well. Reinforced abstinence until treatment completed plus an additional 10 days, offered condoms and encouraged use. Advised patient to notify sexual partners for testing and treatment. Patient verbalized understanding.   Visit completed with use of [employer] interpreter [facility] ID# [phone].  Sandie Ano, RN

## 2020-06-10 ENCOUNTER — Encounter: Payer: Self-pay | Admitting: Infectious Diseases

## 2020-06-14 ENCOUNTER — Encounter: Payer: Self-pay | Admitting: Infectious Diseases

## 2020-06-16 ENCOUNTER — Encounter: Payer: Self-pay | Admitting: Infectious Diseases

## 2020-07-05 ENCOUNTER — Encounter: Payer: Self-pay | Admitting: Infectious Diseases

## 2020-07-05 ENCOUNTER — Other Ambulatory Visit: Payer: Self-pay

## 2020-07-05 ENCOUNTER — Ambulatory Visit (INDEPENDENT_AMBULATORY_CARE_PROVIDER_SITE_OTHER): Payer: Self-pay | Admitting: Infectious Diseases

## 2020-07-05 VITALS — BP 116/72 | HR 61 | Temp 98.1°F | Ht 70.0 in | Wt 153.0 lb

## 2020-07-05 DIAGNOSIS — Z23 Encounter for immunization: Secondary | ICD-10-CM

## 2020-07-05 DIAGNOSIS — B20 Human immunodeficiency virus [HIV] disease: Secondary | ICD-10-CM

## 2020-07-05 DIAGNOSIS — A539 Syphilis, unspecified: Secondary | ICD-10-CM

## 2020-07-05 DIAGNOSIS — Z79899 Other long term (current) drug therapy: Secondary | ICD-10-CM

## 2020-07-05 DIAGNOSIS — Z113 Encounter for screening for infections with a predominantly sexual mode of transmission: Secondary | ICD-10-CM

## 2020-07-05 NOTE — Progress Notes (Signed)
   Subjective:    Patient ID: Sean Dodson, male  DOB: [birth year], 40 y.o.        MRN: [id number]   HPI 39yo M with newly dx 02-2014 HIV+. Was born in [place], came to [place] in end of 2015. Also dx, tx for schistosomiasis 2016. Ongenvoya- no problems with this, rare missed doses.  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 He was given IM Pen.   Has gotten COVID vax. No problems with this.  Works for [employer].   HIV 1 RNA Quant  Date Value  05/27/2020 <20 Copies/mL (H)  08/28/2019 <20 DETECTED copies/mL (A)  10/29/2018 28,800 copies/mL (H)   CD4 T Cell Abs (/uL)  Date Value  08/28/2019 524  01/07/2018 530  04/01/2017 450     Health Maintenance  Topic Date Due  . TETANUS/TDAP  Never done  . INFLUENZA VACCINE  09/05/2020  . Zoster Vaccines- Shingrix (1 of 2) 11/09/2032  . Hepatitis C Screening  Completed  . HIV Screening  Completed  . HPV VACCINES  Aged Out      Review of Systems  Constitutional: Negative for chills, fever and weight loss.  Respiratory: Negative for cough and shortness of breath.   Gastrointestinal: Negative for constipation and diarrhea.  Genitourinary: Negative for dysuria.  Psychiatric/Behavioral: The patient does not have insomnia.     Please see HPI. All other systems reviewed and negative.     Objective:  Physical Exam Vitals reviewed.  Constitutional:      Appearance: Normal appearance.  HENT:     Mouth/Throat:     Mouth: Mucous membranes are moist.     Pharynx: No oropharyngeal exudate.  Eyes:     Extraocular Movements: Extraocular movements intact.     Pupils: Pupils are equal, round, and reactive to light.  Cardiovascular:     Rate and Rhythm: Normal rate and regular rhythm.  Pulmonary:     Effort: Pulmonary effort is normal.     Breath sounds: Normal breath sounds.  Abdominal:     General: Bowel sounds are normal. There is no distension.     Palpations: Abdomen is soft.     Tenderness: There is no abdominal  tenderness.  Musculoskeletal:        General: Normal range of motion.     Cervical back: Normal range of motion and neck supple.     Right lower leg: No edema.     Left lower leg: No edema.  Neurological:     General: No focal deficit present.     Mental Status: He is alert.  Psychiatric:        Mood and Affect: Mood normal.            Assessment & Plan:

## 2020-07-05 NOTE — Assessment & Plan Note (Signed)
Given Pen 05-2020 Will recheck today.

## 2020-07-05 NOTE — Assessment & Plan Note (Signed)
Doing well Given condoms vax are up to date except PCV 23.  Does not have documentation of flu shot for 2021/22 Will see him back in 9 months.

## 2020-07-05 NOTE — Addendum Note (Signed)
Addended by: Andree Coss on: 07/05/2020 04:25 PM   Modules accepted: Orders

## 2020-07-07 ENCOUNTER — Other Ambulatory Visit: Payer: Self-pay | Admitting: Infectious Diseases

## 2020-07-07 DIAGNOSIS — B20 Human immunodeficiency virus [HIV] disease: Secondary | ICD-10-CM

## 2020-07-07 LAB — RPR: RPR Ser Ql: REACTIVE — AB

## 2020-07-07 LAB — RPR TITER: RPR Titer: 1:16 {titer} — ABNORMAL HIGH

## 2020-07-07 LAB — FLUORESCENT TREPONEMAL AB(FTA)-IGG-BLD: Fluorescent Treponemal ABS: REACTIVE — AB

## 2020-11-01 ENCOUNTER — Encounter: Payer: Self-pay | Admitting: Physician Assistant

## 2020-11-01 ENCOUNTER — Ambulatory Visit (INDEPENDENT_AMBULATORY_CARE_PROVIDER_SITE_OTHER): Payer: Self-pay | Admitting: Physician Assistant

## 2020-11-01 ENCOUNTER — Other Ambulatory Visit: Payer: Self-pay

## 2020-11-01 VITALS — BP 111/63 | Temp 98.0°F | Ht 68.0 in | Wt 155.6 lb

## 2020-11-01 DIAGNOSIS — R399 Unspecified symptoms and signs involving the genitourinary system: Secondary | ICD-10-CM

## 2020-11-01 DIAGNOSIS — B20 Human immunodeficiency virus [HIV] disease: Secondary | ICD-10-CM

## 2020-11-01 DIAGNOSIS — B04 Monkeypox: Secondary | ICD-10-CM

## 2020-11-01 DIAGNOSIS — Z113 Encounter for screening for infections with a predominantly sexual mode of transmission: Secondary | ICD-10-CM

## 2020-11-01 MED ORDER — DOXYCYCLINE HYCLATE 100 MG PO TABS
100.0000 mg | ORAL_TABLET | Freq: Two times a day (BID) | ORAL | 0 refills | Status: AC
Start: 2020-11-01 — End: 2020-11-08

## 2020-11-01 MED ORDER — CEFTRIAXONE SODIUM 500 MG IJ SOLR
500.0000 mg | Freq: Once | INTRAMUSCULAR | Status: AC
  Administered 2020-11-01: 500 mg via INTRAMUSCULAR

## 2020-11-01 NOTE — Patient Instructions (Signed)
You will need to stay isolated at home with well fitting mask over your nose and mouth for 3 weeks after your first rash came up, or until they are all crusted and fall off, whichever is first.   Disinfect common surfaces, bed & bath linens and clothing.    See below for more information.    HUMAN MONKEYPOX ISOLATION RECOMMENDATIONS [email or web address].uy.html  A person with monkeypox can spread it to others from the time symptoms start until the rash has fully healed and a fresh layer of skin has formed. The illness typically lasts 2-4 weeks  Monkeypox can spread to anyone through close, personal, often skin-to-skin contact, including: Direct contact with monkeypox rash, scabs, or body fluids from a person with monkeypox. Touching objects, fabrics (clothing, bedding, or towels), and surfaces that have been used by someone with monkeypox. Contact with respiratory secretions. This direct contact can happen during intimate contact, including: Oral, anal, and vaginal sex or touching the genitals (penis, testicles, labia, and vagina) or anus (butthole) of a person with monkeypox. Hugging, massage, and kissing. Prolonged face-to-face contact. Touching fabrics and objects during sex that were used by a person with monkeypox and that have not been disinfected, such as bedding, towels, fetish gear, and sex toys. A pregnant person can spread the virus to their fetus through the placenta. It's also possible for people to get monkeypox from infected animals, either by being scratched or bitten by the animal or by preparing or eating meat or using products from an infected animal. Isolation typically lasts two to four weeks.   If you are unable to isolate from others:  While symptomatic with a fever or any respiratory symptoms, including sore throat, nasal congestion, or cough, remain isolated in the home and away from others unless it is necessary to see  a healthcare provider or for an emergency. This includes avoiding close or physical contact with other people and animals. Cover the lesions, wear a well-fitting mask (more information below), and avoid public transportation when leaving the home as required for medical care or an emergency. While a rash persists but in the absence of a fever or respiratory symptoms Cover all parts of the rash with clothing, gloves, and/or bandages. Wear a well-fitting mask to prevent the wearer from spreading oral and respiratory secretions when interacting with others until the rash and all other symptoms have resolved. Masks should fit closely on the face without any gaps along the edges or around the nose and be comfortable when worn properly over the nose and mouth.  Until all signs and symptoms of monkeypox illness have fully resolved Do not share items that have been worn or handled with other people or animals. Launder or disinfect items that have been worn or handled and surfaces that have been touched by a lesion. Avoid close physical contact, including sexual and/or close intimate contact, with other people. Avoid sharing utensils or cups. Items should be cleaned and disinfected before use by others. Avoid crowds and congregate settings. Wash hands often with soap and water or use an alcohol-based hand sanitizer, especially after direct contact with the rash.    Prevention in your Pets:  People with monkeypox should avoid contact with animals (specifically mammals), including pets. If possible, friends or family members should care for healthy animals until the owner has fully recovered. Keep any potentially infectious bandages, textiles (such as clothes, bedding) and other items away from pets, other domestic animals, and wildlife. In general, any mammal may become infected  with monkeypox. It is not thought that other animals such as reptiles, fish or birds can be infected. If you notice an animal  that had contact with an infected person appears sick (such as lethargy, lack of appetite, coughing, bloating, nasal or eye secretions or crust, fever, rash) contact the Loss adjuster, chartered, Armed forces training and education officer, or state animal health official.

## 2020-11-01 NOTE — Progress Notes (Signed)
Subjective:    Patient ID: Sean Dodson, male    DOB: [birth year], 40 y.o.   MRN: [id number]  Chief Complaint  Patient presents with   Rash    Pimple like lesions, STI screening     HPI: Sean Dodson [id number] interpreter. Sean Dodson is a 40 y.o. male with HIV-1 that is well controlled on Genvoya.  He is french speaking and virtual interpreter is present throughout visit. Treated in May 2022 for syphilis.  He speaks [place] and originally from [place]. He is concerned about possible STI.  He is MSM, engaged in condomless oral and anal intercourse 10 days ago and previously with condoms in the last 4 weeks.  He has no known exposures to STIs or MPX.  He complains of symptoms beginning 10/25/20 with fever, headaches , myalgias, joint pain, burning while voiding x 3 days and lesions on face, extremities, trunk.    PMH: syphilis treatment that was in may 2022.  Work-[employer]  Lives with cousin  No pets           Allergies  Allergen Reactions   Novacort    Quinine Derivatives       Outpatient Medications Prior to Visit  Medication Sig Dispense Refill   GENVOYA 150-150-200-10 MG TABS tablet TAKE 1 TABLET BY MOUTH DAILY 30 tablet 4   No facility-administered medications prior to visit.     Past Medical History:  Diagnosis Date   Gross hematuria    HIV disease [facility])      Past Surgical History:  Procedure Laterality Date   CYSTOSCOPY W/ URETERAL STENT PLACEMENT Bilateral 06/02/2014   Procedure: CYSTOSCOPY/BLADDER BIOPSY WITH BILATERAL RETROGRADE PYELOGRAM;  Surgeon: Crist Fat, MD;  Location: [facility];  Service: Urology;  Laterality: Bilateral;       Review of Systems  Constitutional:  Positive for fatigue. Negative for appetite change, chills and fever.  HENT:  Positive for sore throat. Negative for mouth sores, rhinorrhea, sinus pain and tinnitus.   Respiratory:  Negative for cough, shortness of breath and wheezing.   Cardiovascular:   Negative for palpitations and leg swelling.  Gastrointestinal:  Negative for abdominal pain, anal bleeding, blood in stool, constipation, diarrhea, nausea, rectal pain and vomiting.  Genitourinary:  Positive for dysuria. Negative for decreased urine volume, difficulty urinating, flank pain, genital sores, hematuria, penile discharge, penile pain, penile swelling, scrotal swelling, testicular pain and urgency.  Musculoskeletal:  Positive for arthralgias and myalgias.  Skin:  Positive for rash.  Allergic/Immunologic: Negative for immunocompromised state.  Neurological:  Positive for headaches. Negative for dizziness, weakness and light-headedness.  Hematological:  Positive for adenopathy.  Psychiatric/Behavioral: Negative.       Objective:    BP 111/63   Temp 98 F (36.7 C)   Wt 155 lb 9.6 oz (70.6 kg)   BMI 22.33 kg/m  Nursing note and vital signs reviewed.  Physical Exam Vitals reviewed.  Constitutional:      General: He is not in acute distress.    Appearance: Normal appearance. He is normal weight. He is not ill-appearing.  HENT:     Head: Normocephalic.     Mouth/Throat:     Mouth: Mucous membranes are moist.     Pharynx: Oropharynx is clear. No oropharyngeal exudate or posterior oropharyngeal erythema.  Eyes:     Extraocular Movements: Extraocular movements intact.     Conjunctiva/sclera: Conjunctivae normal.     Pupils: Pupils are equal, round, and reactive to light.  Cardiovascular:  Rate and Rhythm: Normal rate and regular rhythm.  Pulmonary:     Effort: Pulmonary effort is normal.     Breath sounds: Normal breath sounds.  Musculoskeletal:        General: Normal range of motion.     Cervical back: Normal range of motion.  Lymphadenopathy:     Cervical: Cervical adenopathy present.  Skin:    Findings: Lesion present.     Comments: Lesions x 3 on face, left and right upper extremities, trunk 5 mm in diameter pustular, vesicular with central umbilication,  erythematous margins, nttp, no d/c, no induration or exudate  Neurological:     General: No focal deficit present.     Mental Status: He is alert and oriented to person, place, and time.  Psychiatric:        Mood and Affect: Mood normal.        Behavior: Behavior normal.     Depression screen [facility] 2/9 07/05/2020 09/11/2019 11/14/2018 04/15/2017 11/07/2015  Decreased Interest 0 0 0 0 0  Down, Depressed, Hopeless 0 0 0 0 0  PHQ - 2 Score 0 0 0 0 0       Assessment & Plan:  40 minutes of face to face time with patient and interpreter Reviewed lab work from 06/2020 STI screening-sent GC/Chlamydia all 3 sites, empirically treated for gonorrhea and chlamydia  -rocephin 500 mg IM once -doxy 100 mg bid x 7 days -suspect possible coinfection with monkeypox.   -Encouraged to wear condoms always  HMPX-presumptive diagnosis-clinically consistent- -discussed isolation, management, treatment, supportive care, follow up, transmission, evolutio of lesions over then ext 21 days -note for work until evaluation on 11/15/20 -Does not qualify for TPOXX per recent CDC guidelines  HIV-1-continue genvoya as directed, Vl undetectable May 22-follow up with Sean Dodson as scheduled  Patient Active Problem List   Diagnosis Date Noted   Syphilis in male 11/14/2018   Hepatitis B immune 04/15/2017   Hepatitis A immune 04/15/2017   Schistosomiasis 06/23/2014   Refugee health examination 06/23/2014   HIV disease (HCC) 02/23/2014     Problem List Items Addressed This Visit       Other   HIV disease (HCC) - Primary   Relevant Medications   cefTRIAXone (ROCEPHIN) injection 500 mg   Other Visit Diagnoses     Venereal disease screening       Relevant Orders   Cytology (oral, anal, urethral) ancillary only   Urine cytology ancillary only   RPR   Cytology (oral, anal, urethral) ancillary only   Monkeypox       Relevant Medications   cefTRIAXone (ROCEPHIN) injection 500 mg   Other Relevant Orders    Monkeypox Virus DNA, Qualitative Real-Time PCR   Monkeypox Virus DNA, Qualitative Real-Time PCR   Lower urinary tract symptoms       Relevant Medications   cefTRIAXone (ROCEPHIN) injection 500 mg   doxycycline (VIBRA-TABS) 100 MG tablet        I am having Sean Dodson "Sean Dodson" start on doxycycline. I am also having him maintain his Genvoya. We will continue to administer cefTRIAXone.   Meds ordered this encounter  Medications   cefTRIAXone (ROCEPHIN) injection 500 mg   doxycycline (VIBRA-TABS) 100 MG tablet    Sig: Take 1 tablet (100 mg total) by mouth 2 (two) times daily for 7 days.    Dispense:  14 tablet    Refill:  0    Order Specific Question:   Supervising Provider  Answer:   Sean Dodson, Sean Dodson [[id number]]      Follow-up: Return in about 2 weeks (around 11/15/2020).

## 2020-11-02 LAB — CYTOLOGY, (ORAL, ANAL, URETHRAL) ANCILLARY ONLY
Chlamydia: NEGATIVE
Chlamydia: POSITIVE — AB
Comment: NEGATIVE
Comment: NEGATIVE
Comment: NORMAL
Comment: NORMAL
Neisseria Gonorrhea: NEGATIVE
Neisseria Gonorrhea: NEGATIVE

## 2020-11-02 LAB — URINE CYTOLOGY ANCILLARY ONLY
Chlamydia: NEGATIVE
Comment: NEGATIVE
Comment: NORMAL
Neisseria Gonorrhea: NEGATIVE

## 2020-11-03 LAB — MONKEYPOX VIRUS DNA, QUALITATIVE REAL-TIME PCR
MONKEYPOX VIRUS DNA, QL PCR: DETECTED — CR
Orthopoxvirus DNA, QL PCR: DETECTED — CR

## 2020-11-03 LAB — FLUORESCENT TREPONEMAL AB(FTA)-IGG-BLD: Fluorescent Treponemal ABS: REACTIVE — AB

## 2020-11-03 LAB — RPR: RPR Ser Ql: REACTIVE — AB

## 2020-11-03 LAB — RPR TITER: RPR Titer: 1:4 {titer} — ABNORMAL HIGH

## 2020-11-04 LAB — MONKEYPOX VIRUS DNA, QUALITATIVE REAL-TIME PCR
MONKEYPOX VIRUS DNA, QL PCR: DETECTED — CR
Orthopoxvirus DNA, QL PCR: DETECTED — CR

## 2020-11-15 ENCOUNTER — Other Ambulatory Visit: Payer: Self-pay | Admitting: Infectious Diseases

## 2020-11-15 ENCOUNTER — Ambulatory Visit (INDEPENDENT_AMBULATORY_CARE_PROVIDER_SITE_OTHER): Payer: Self-pay | Admitting: Physician Assistant

## 2020-11-15 ENCOUNTER — Encounter: Payer: Self-pay | Admitting: Physician Assistant

## 2020-11-15 ENCOUNTER — Other Ambulatory Visit: Payer: Self-pay

## 2020-11-15 DIAGNOSIS — B20 Human immunodeficiency virus [HIV] disease: Secondary | ICD-10-CM

## 2020-11-15 DIAGNOSIS — Z79899 Other long term (current) drug therapy: Secondary | ICD-10-CM

## 2020-11-15 DIAGNOSIS — B04 Monkeypox: Secondary | ICD-10-CM

## 2020-11-15 DIAGNOSIS — Z113 Encounter for screening for infections with a predominantly sexual mode of transmission: Secondary | ICD-10-CM

## 2020-11-15 DIAGNOSIS — A749 Chlamydial infection, unspecified: Secondary | ICD-10-CM

## 2020-11-15 DIAGNOSIS — A539 Syphilis, unspecified: Secondary | ICD-10-CM

## 2020-11-15 NOTE — Progress Notes (Signed)
Subjective:    Patient ID: Sean Dodson, male    DOB: 1980-07-12, 40 y.o.   MRN: [id number]  Chief Complaint  Patient presents with   Follow-up    MPX-without treatment     Virtual Visit via Telephone/Video Note   I connected with on 11/15/2020 at by telephone and verified that I am speaking with the correct person using two identifiers.   I discussed the limitations, risks, security and privacy concerns of performing an evaluation and management service by telephone and the availability of in person appointments. I also discussed with the patient that there may be a patient responsible charge related to this service. The patient expressed understanding and agreed to proceed.  Location:  Patient: Home Provider: Clinic   HPI:  Sean Dodson is a 40 y.o. male with well controlled HIV-1, he reports for follow up regarding HMPX diagnosis confirmed 11/01/2020 as well as rectal chlamydia.  The rectal chlamydia was treated in clinic 11/01/2020 with doxycycline 100 mg bid x 7 days.  He was not a candidate for TPOXX, but states all lesions have resolved, new skin growth is present.  He reports all symptoms have resolved and he is feeling 100%.  He remained isolated since 11/01/2020 and needs to return to work by 11/18/2020.  He has disinfected surfaces in his home and sterilized linens.    Allergies  Allergen Reactions   Novacort    Quinine Derivatives       Outpatient Medications Prior to Visit  Medication Sig Dispense Refill   GENVOYA 150-150-200-10 MG TABS tablet TAKE 1 TABLET BY MOUTH DAILY 30 tablet 4   No facility-administered medications prior to visit.     Past Medical History:  Diagnosis Date   Gross hematuria    HIV disease [facility])      Past Surgical History:  Procedure Laterality Date   CYSTOSCOPY W/ URETERAL STENT PLACEMENT Bilateral 06/02/2014   Procedure: CYSTOSCOPY/BLADDER BIOPSY WITH BILATERAL RETROGRADE PYELOGRAM;  Surgeon: Crist Fat, MD;   Location: [facility];  Service: Urology;  Laterality: Bilateral;       Review of Systems  Constitutional: Negative.   HENT: Negative.    Eyes: Negative.   Respiratory: Negative.    Cardiovascular: Negative.   Gastrointestinal: Negative.   Genitourinary: Negative.   Musculoskeletal: Negative.   Allergic/Immunologic: Negative for immunocompromised state.  Neurological: Negative.   Hematological:  Negative for adenopathy.  Psychiatric/Behavioral: Negative.       Objective:         Assessment & Plan:  Per our discussion today clinically appears to have resolution of HMPX, will return to work 11/18/2020.  He may return to community activities, encouraged condoms for the prevention of STIs and advised to wear for 12 weeks post resolution of HMPX.  He will follow with Dr. Ninetta Lights for ongoing HIV-1 care.   No interpreter used for this discussion, patient voiced understanding and all questions were answered. Problem List Items Addressed This Visit       Other   Chlamydia infection   Human monkeypox - Primary     I am having Sean Dodson "Sean Dodson" maintain his Genvoya.   No orders of the defined types were placed in this encounter.    I discussed the assessment and treatment plan with the patient. The patient was provided an opportunity to ask questions and all were answered. The patient agreed with the plan and demonstrated an understanding of the instructions.   The patient was advised to  call back or seek an in-person evaluation if the symptoms worsen or if the condition fails to improve as anticipated.   I provided   10 minutes of non-face-to-face time during this encounter.  Follow-up: Return if symptoms worsen or fail to improve.   Marcos Eke, MSN, FNP-C Nurse Practitioner [facility] Main number: [phone]

## 2020-12-01 ENCOUNTER — Other Ambulatory Visit: Payer: Self-pay | Admitting: Infectious Diseases

## 2020-12-01 DIAGNOSIS — B20 Human immunodeficiency virus [HIV] disease: Secondary | ICD-10-CM

## 2020-12-19 ENCOUNTER — Encounter: Payer: Self-pay | Admitting: Infectious Diseases

## 2021-03-10 ENCOUNTER — Ambulatory Visit: Payer: Self-pay | Admitting: Infectious Diseases

## 2021-04-06 ENCOUNTER — Encounter: Payer: Self-pay | Admitting: Infectious Diseases

## 2021-04-06 ENCOUNTER — Ambulatory Visit: Payer: Self-pay

## 2021-04-06 ENCOUNTER — Ambulatory Visit: Payer: Self-pay | Admitting: Infectious Diseases

## 2021-04-06 ENCOUNTER — Ambulatory Visit (INDEPENDENT_AMBULATORY_CARE_PROVIDER_SITE_OTHER): Payer: Self-pay

## 2021-04-06 ENCOUNTER — Other Ambulatory Visit: Payer: Self-pay

## 2021-04-06 VITALS — BP 123/79 | HR 55 | Resp 16 | Ht 68.0 in | Wt 156.5 lb

## 2021-04-06 DIAGNOSIS — B20 Human immunodeficiency virus [HIV] disease: Secondary | ICD-10-CM

## 2021-04-06 DIAGNOSIS — Z113 Encounter for screening for infections with a predominantly sexual mode of transmission: Secondary | ICD-10-CM

## 2021-04-06 DIAGNOSIS — Z79899 Other long term (current) drug therapy: Secondary | ICD-10-CM

## 2021-04-06 DIAGNOSIS — B04 Monkeypox: Secondary | ICD-10-CM

## 2021-04-06 DIAGNOSIS — A539 Syphilis, unspecified: Secondary | ICD-10-CM

## 2021-04-06 DIAGNOSIS — Z23 Encounter for immunization: Secondary | ICD-10-CM

## 2021-04-06 NOTE — Assessment & Plan Note (Signed)
Will recheck his RPR today.  

## 2021-04-06 NOTE — Progress Notes (Signed)
? ?  Covid-19 Vaccination Clinic ? ?Name:  Sean Dodson    ?MRN: [id number] ?DOB: 1980-06-26 ? ?04/06/2021 ? ?Sean Dodson was observed post Covid-19 immunization for 15 minutes without incident. He was provided with Vaccine Information Sheet and instruction to access the V-Safe system.  ? ?[name] was instructed to call 911 with any severe reactions post vaccine: ?Difficulty breathing  ?Swelling of face and throat  ?A fast heartbeat  ?A bad rash all over body  ?Dizziness and weakness  ? ? [name] ? ? ?

## 2021-04-06 NOTE — Progress Notes (Signed)
? ?  Subjective:  ? ? Patient ID: Sean Dodson, male  DOB: 31-Jul-1980, 41 y.o.        MRN: [id number] ? ? ?HPI ?41 yo M with newly dx 02-2014 HIV+. Was born in [place], came to [place] in end of 2015. ?Also dx, tx for schistosomiasis 2016. ?On genvoya- no problems with this, rare missed doses.  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  ? ?Denies missed doses of meds. Has been doing well. Has gained wt BMI 23.  ?His mpox lesions have resolved.  ?Has been working for [employer].  ?Needs labs today.  ? ?HIV 1 RNA Quant  ?Date Value  ?05/27/2020 <20 Copies/mL (H)  ?08/28/2019 <20 DETECTED copies/mL (A)  ?10/29/2018 28,800 copies/mL (H)  ? ?CD4 T Cell Abs (/uL)  ?Date Value  ?08/28/2019 524  ?01/07/2018 530  ?04/01/2017 450  ? ? ? ?Health Maintenance  ?Topic Date Due  ? TETANUS/TDAP  Never done  ? INFLUENZA VACCINE  09/05/2020  ? Hepatitis C Screening  Completed  ? HIV Screening  Completed  ? HPV VACCINES  Aged Out  ? ? ? ?Review of Systems  ?Constitutional:  Negative for chills, fever and weight loss.  ?Respiratory:  Negative for shortness of breath.   ?Gastrointestinal:  Negative for blood in stool and constipation.  ?Genitourinary:  Negative for dysuria.  ?Psychiatric/Behavioral:  The patient does not have insomnia.   ? ?Please see HPI. All other systems reviewed and negative. ? ?   ?Objective:  ?Physical Exam ?Vitals reviewed.  ?Constitutional:   ?   General: He is not in acute distress. ?   Appearance: Normal appearance. He is not toxic-appearing.  ?HENT:  ?   Mouth/Throat:  ?   Mouth: Mucous membranes are moist.  ?   Pharynx: No oropharyngeal exudate.  ?Eyes:  ?   Extraocular Movements: Extraocular movements intact.  ?   Pupils: Pupils are equal, round, and reactive to light.  ?Cardiovascular:  ?   Rate and Rhythm: Normal rate and regular rhythm.  ?Pulmonary:  ?   Effort: Pulmonary effort is normal.  ?   Breath sounds: Normal breath sounds.  ?Abdominal:  ?    General: Bowel sounds are normal. There is no distension.  ?   Palpations: Abdomen is soft.  ?   Tenderness: There is no abdominal tenderness.  ?Musculoskeletal:     ?   General: Normal range of motion.  ?   Cervical back: Normal range of motion and neck supple.  ?   Right lower leg: No edema.  ?   Left lower leg: No edema.  ?Neurological:  ?   General: No focal deficit present.  ?   Mental Status: He is alert.  ?Psychiatric:     ?   Mood and Affect: Mood normal.  ? ? ? ? ? ? ?   ?Assessment & Plan:  ? ?

## 2021-04-06 NOTE — Addendum Note (Signed)
Addended by: Theresia Majors A on: 04/06/2021 04:47 PM ? ? Modules accepted: Orders ? ?

## 2021-04-06 NOTE — Assessment & Plan Note (Addendum)
He is doing very well.  ?Will check his labs today.  ?Continue on genvoya.  ?Will see him back in 9 months with labs prior.  ?Given condoms.  ?Needs PCV 15 or 20 > 06-2021 ?TDap today ?COVID today ?

## 2021-04-06 NOTE — Assessment & Plan Note (Signed)
Resolved. No scarring.  ?

## 2021-05-23 ENCOUNTER — Other Ambulatory Visit: Payer: Self-pay | Admitting: Infectious Diseases

## 2021-05-23 DIAGNOSIS — B20 Human immunodeficiency virus [HIV] disease: Secondary | ICD-10-CM

## 2021-06-28 ENCOUNTER — Encounter: Payer: Self-pay | Admitting: Infectious Diseases

## 2021-09-25 ENCOUNTER — Other Ambulatory Visit: Payer: Self-pay | Admitting: Infectious Diseases

## 2021-09-25 DIAGNOSIS — B20 Human immunodeficiency virus [HIV] disease: Secondary | ICD-10-CM

## 2021-10-30 ENCOUNTER — Ambulatory Visit: Payer: Self-pay

## 2021-11-02 ENCOUNTER — Ambulatory Visit (INDEPENDENT_AMBULATORY_CARE_PROVIDER_SITE_OTHER): Payer: Self-pay

## 2021-11-02 ENCOUNTER — Other Ambulatory Visit: Payer: Self-pay

## 2021-11-02 DIAGNOSIS — Z23 Encounter for immunization: Secondary | ICD-10-CM

## 2021-11-21 ENCOUNTER — Telehealth: Payer: Self-pay | Admitting: Infectious Diseases

## 2021-11-21 NOTE — Telephone Encounter (Signed)
Called pt and he would like appt at [facility]

## 2021-11-30 ENCOUNTER — Ambulatory Visit: Payer: Self-pay

## 2021-11-30 ENCOUNTER — Other Ambulatory Visit: Payer: Self-pay

## 2021-11-30 DIAGNOSIS — Z113 Encounter for screening for infections with a predominantly sexual mode of transmission: Secondary | ICD-10-CM

## 2021-11-30 DIAGNOSIS — B20 Human immunodeficiency virus [HIV] disease: Secondary | ICD-10-CM

## 2021-12-04 ENCOUNTER — Other Ambulatory Visit: Payer: Self-pay

## 2021-12-04 ENCOUNTER — Encounter: Payer: Self-pay | Admitting: Internal Medicine

## 2021-12-04 ENCOUNTER — Ambulatory Visit: Payer: Self-pay

## 2021-12-04 DIAGNOSIS — Z113 Encounter for screening for infections with a predominantly sexual mode of transmission: Secondary | ICD-10-CM

## 2021-12-04 DIAGNOSIS — B20 Human immunodeficiency virus [HIV] disease: Secondary | ICD-10-CM

## 2021-12-05 LAB — URINE CYTOLOGY ANCILLARY ONLY
Chlamydia: NEGATIVE
Comment: NEGATIVE
Comment: NORMAL
Neisseria Gonorrhea: NEGATIVE

## 2021-12-05 LAB — T-HELPER CELL (CD4) - (RCID CLINIC ONLY)
CD4 % Helper T Cell: 33 % (ref 33–65)
CD4 T Cell Abs: 646 /uL (ref 400–1790)

## 2021-12-06 LAB — COMPLETE METABOLIC PANEL WITH GFR
AG Ratio: 1.4 (calc) (ref 1.0–2.5)
ALT: 18 U/L (ref 9–46)
AST: 24 U/L (ref 10–40)
Albumin: 4 g/dL (ref 3.6–5.1)
Alkaline phosphatase (APISO): 57 U/L (ref 36–130)
BUN/Creatinine Ratio: 13 (calc) (ref 6–22)
BUN: 17 mg/dL (ref 7–25)
CO2: 27 mmol/L (ref 20–32)
Calcium: 9.4 mg/dL (ref 8.6–10.3)
Chloride: 106 mmol/L (ref 98–110)
Creat: 1.29 mg/dL — ABNORMAL HIGH (ref 0.60–1.26)
Globulin: 2.9 g/dL (calc) (ref 1.9–3.7)
Glucose, Bld: 94 mg/dL (ref 65–99)
Potassium: 4.4 mmol/L (ref 3.5–5.3)
Sodium: 140 mmol/L (ref 135–146)
Total Bilirubin: 0.5 mg/dL (ref 0.2–1.2)
Total Protein: 6.9 g/dL (ref 6.1–8.1)
eGFR: 72 mL/min/{1.73_m2} (ref >=60)

## 2021-12-06 LAB — CBC WITH DIFFERENTIAL/PLATELET
Absolute Monocytes: 280 cells/uL (ref 200–950)
Basophils Absolute: 32 cells/uL (ref 0–200)
Basophils Relative: 0.9 %
Eosinophils Absolute: 60 cells/uL (ref 15–500)
Eosinophils Relative: 1.7 %
HCT: 40.7 % (ref 38.5–50.0)
Hemoglobin: 14.2 g/dL (ref 13.2–17.1)
Lymphs Abs: 2083 cells/uL (ref 850–3900)
MCH: 32.7 pg (ref 27.0–33.0)
MCHC: 34.9 g/dL (ref 32.0–36.0)
MCV: 93.8 fL (ref 80.0–100.0)
MPV: 11.7 fL (ref 7.5–12.5)
Monocytes Relative: 8 %
Neutro Abs: 1047 cells/uL — ABNORMAL LOW (ref 1500–7800)
Neutrophils Relative %: 29.9 %
Platelets: 162 10*3/uL (ref 140–400)
RBC: 4.34 10*6/uL (ref 4.20–5.80)
RDW: 10.8 % — ABNORMAL LOW (ref 11.0–15.0)
Total Lymphocyte: 59.5 %
WBC: 3.5 10*3/uL — ABNORMAL LOW (ref 3.8–10.8)

## 2021-12-06 LAB — FLUORESCENT TREPONEMAL AB(FTA)-IGG-BLD: Fluorescent Treponemal ABS: REACTIVE — AB

## 2021-12-06 LAB — HIV-1 RNA QUANT-NO REFLEX-BLD
HIV 1 RNA Quant: <20 Copies/mL — ABNORMAL HIGH
HIV-1 RNA Quant, Log: <1.3 Log cps/mL — ABNORMAL HIGH

## 2021-12-06 LAB — LIPID PANEL
Cholesterol: 227 mg/dL — ABNORMAL HIGH (ref <200)
HDL: 65 mg/dL (ref >=40)
LDL Cholesterol (Calc): 129 mg/dL (calc) — ABNORMAL HIGH
Non-HDL Cholesterol (Calc): 162 mg/dL (calc) — ABNORMAL HIGH (ref <130)
Total CHOL/HDL Ratio: 3.5 (calc) (ref <5.0)
Triglycerides: 191 mg/dL — ABNORMAL HIGH (ref <150)

## 2021-12-06 LAB — RPR TITER: RPR Titer: 1:1 {titer} — ABNORMAL HIGH

## 2021-12-06 LAB — RPR: RPR Ser Ql: REACTIVE — AB

## 2021-12-13 ENCOUNTER — Encounter: Payer: Self-pay | Admitting: Internal Medicine

## 2021-12-19 ENCOUNTER — Encounter: Payer: Self-pay | Admitting: Infectious Diseases

## 2021-12-20 ENCOUNTER — Telehealth: Payer: Self-pay | Admitting: *Deleted

## 2021-12-20 NOTE — Telephone Encounter (Signed)
Call placed to patient for today's missed appt. No answer. Left message on VM requesting return call to r/s.

## 2021-12-22 ENCOUNTER — Ambulatory Visit (INDEPENDENT_AMBULATORY_CARE_PROVIDER_SITE_OTHER): Payer: Self-pay | Admitting: Internal Medicine

## 2021-12-22 ENCOUNTER — Other Ambulatory Visit: Payer: Self-pay

## 2021-12-22 ENCOUNTER — Encounter: Payer: Self-pay | Admitting: Internal Medicine

## 2021-12-22 VITALS — BP 117/72 | HR 61 | Temp 97.9°F | Wt 153.0 lb

## 2021-12-22 DIAGNOSIS — B20 Human immunodeficiency virus [HIV] disease: Secondary | ICD-10-CM

## 2021-12-22 DIAGNOSIS — Z8619 Personal history of other infectious and parasitic diseases: Secondary | ICD-10-CM

## 2021-12-22 DIAGNOSIS — Z9189 Other specified personal risk factors, not elsewhere classified: Secondary | ICD-10-CM

## 2021-12-22 MED ORDER — DOLUTEGRAVIR-LAMIVUDINE 50-300 MG PO TABS: 1.0000 | ORAL_TABLET | Freq: Every day | ORAL | 11 refills | Status: DC

## 2021-12-22 NOTE — Patient Instructions (Addendum)
We will switch your genvoya to dovato; when you receive dovato can stop genvoya and start the new medication   See [place] again in around march or April next year. Will discuss putting you on lipitor (a cholesterol medication) to reduce risk of stroke/heart attack which is high in patient living with hiv   We can do blood tests after our next visit so you don't have to make 2 trips

## 2021-12-22 NOTE — Progress Notes (Signed)
Subjective:    Patient ID: Sean Dodson, male  DOB: [birth year], 41 y.o.        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 He used to live in [place] and left 2 years ago to live in [place] for work.  -his sister still lives in [place] -he wants to continue coming to our clinic -no smoking/drinking/substance use -works for [employer] -he is no longer married; his previous wife is in [employer] -he is not in a relationship. He is sexually active    Patient was last seen by dr Johnnye Sima 11/21/21 -------------- 41 yo M with newly dx 02-2014 HIV+. Was born in [place], came to [place] in end of 2015. Also dx, tx for schistosomiasis 2016. On genvoya- no problems with this, rare missed doses.  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   Denies missed doses of meds. Has been doing well. Has gained wt BMI 23.  His mpox lesions have resolved.  Has been working for [employer].  Needs labs today.   HIV 1 RNA Quant  Date Value  12/04/2021 <20 Copies/mL (H)  05/27/2020 <20 Copies/mL (H)  08/28/2019 <20 DETECTED copies/mL (A)   CD4 T Cell Abs (/uL)  Date Value  12/04/2021 646  08/28/2019 524  01/07/2018 530     Health Maintenance  Topic Date Due   COVID-19 Vaccine (4 - Pfizer risk series) 06/01/2021   INFLUENZA VACCINE  Completed   Hepatitis C Screening  Completed   HIV Screening  Completed   HPV VACCINES  Aged Out     ROS: Please see  HPI. All other systems reviewed and negative.     Objective:  Physical Exam Vitals reviewed.  Constitutional:      General: He is not in acute distress.    Appearance: Normal appearance. He is not toxic-appearing.  HENT:     Mouth/Throat:     Mouth: Mucous membranes are moist.     Pharynx: No oropharyngeal exudate.  Eyes:     Extraocular Movements: Extraocular movements intact.     Pupils: Pupils are equal, round, and reactive to light.  Cardiovascular:     Rate and Rhythm: Normal rate and regular rhythm.  Pulmonary:     Effort: Pulmonary effort is normal.     Breath sounds: Normal breath sounds.  Abdominal:     General: Bowel sounds are normal. There is no distension.     Palpations: Abdomen is soft.     Tenderness: There is no abdominal tenderness.  Musculoskeletal:        General: Normal range of motion.     Cervical back: Normal range of motion and neck supple.     Right lower leg: No edema.     Left lower leg: No edema.  Neurological:     General: No focal deficit present.     Mental Status: He is alert.  Psychiatric:  Mood and Affect: Mood normal.     Labs: Lab Results  Component Value Date   WBC 3.5 (L) 12/04/2021   HGB 14.2 12/04/2021   HCT 40.7 12/04/2021   MCV 93.8 12/04/2021   PLT 162 52/77/8242   Last metabolic panel Lab Results  Component Value Date   GLUCOSE 94 12/04/2021   NA 140 12/04/2021   K 4.4 12/04/2021   CL 106 12/04/2021   CO2 27 12/04/2021   BUN 17 12/04/2021   CREATININE 1.29 (H) 12/04/2021   EGFR 72 12/04/2021   CALCIUM 9.4 12/04/2021   PROT 6.9 12/04/2021   ALBUMIN 4.0 08/20/2016   BILITOT 0.5 12/04/2021   ALKPHOS 52 08/20/2016   AST 24 12/04/2021   ALT 18 12/04/2021   ANIONGAP 6 07/07/2014   Lab Results  Component Value Date   HIV1RNAQUANT <20 (H) 12/04/2021   Lab Results  Component Value Date   CD4TCELL 33 12/04/2021   CD4TABS 646 12/04/2021   11/2021 gc/chlam urine negative; rpr 1:1 improved        Assessment & Plan:  #hiv Well controlled on genvoya Vaccinated and reactive protection to hepatitis B  Discussed option of dovato and he is ok  -start dovato  -stop genvoya when receive dovato -encourage ongoing compliance -labs in 6 months after visit   #hx syphilis Treated early latent 05/2020 and responding Last std screen 11/2021 negative urine gc/chlam   #cad risk discussion Discuss reprieve trial When he is off genvoya will start lipitor 40 mg daily -- will discuss next visit   #hcm -vaccination Doesn't flu or covid vaccine -hepatitis  2015 hep b Sab positive; negative hep b sag and cAb -tb screening -cancer screening    I have spent a total of 40 minutes of face-to-face and non-face-to-face time, excluding clinical staff time, preparing to see patient, ordering tests and/or medications, and provide counseling the patient

## 2022-05-18 ENCOUNTER — Telehealth: Payer: Self-pay

## 2022-05-18 ENCOUNTER — Ambulatory Visit: Payer: Self-pay | Admitting: Internal Medicine

## 2022-05-18 NOTE — Telephone Encounter (Signed)
Attempted to contact patient to reschedule no show on 05/18/2022 with Dr Renold Don. No answer and vm full.

## 2022-06-14 ENCOUNTER — Encounter: Payer: Self-pay | Admitting: Internal Medicine

## 2022-06-14 ENCOUNTER — Other Ambulatory Visit: Payer: Self-pay

## 2022-06-14 ENCOUNTER — Ambulatory Visit (INDEPENDENT_AMBULATORY_CARE_PROVIDER_SITE_OTHER): Payer: Self-pay | Admitting: Internal Medicine

## 2022-06-14 VITALS — BP 109/79 | HR 69 | Resp 16 | Ht 68.0 in | Wt 161.0 lb

## 2022-06-14 DIAGNOSIS — Z1159 Encounter for screening for other viral diseases: Secondary | ICD-10-CM

## 2022-06-14 DIAGNOSIS — A749 Chlamydial infection, unspecified: Secondary | ICD-10-CM

## 2022-06-14 DIAGNOSIS — A549 Gonococcal infection, unspecified: Secondary | ICD-10-CM

## 2022-06-14 DIAGNOSIS — Z111 Encounter for screening for respiratory tuberculosis: Secondary | ICD-10-CM

## 2022-06-14 DIAGNOSIS — Z113 Encounter for screening for infections with a predominantly sexual mode of transmission: Secondary | ICD-10-CM

## 2022-06-14 DIAGNOSIS — B20 Human immunodeficiency virus [HIV] disease: Secondary | ICD-10-CM

## 2022-06-14 NOTE — Patient Instructions (Signed)
Continue your dovato   Will do hiv and sexually transmitted disease screen today   See me again in 6 months -- will plan anal pap smear to look for anal cancer at that time

## 2022-06-14 NOTE — Progress Notes (Signed)
Subjective:    Patient ID: Sean Dodson, male  DOB: Sep 27, 1980, 42 y.o.        MRN: [id number]   HPI Patient is here for continued hiv care   06/14/22 id clinic Switched genvoya to dovato 12/2021; tolerating; no missed dose last 4 weeks Due for labs today He is sexually active -- and wants to do std testing He never had anal pap smear before No concern today though  He continues to get here from [facility] for hiv care   12/2021 id clinic  He has no health concern today Reviewed hiv testing from 11/2021 and he remains well controlled He takes genvoya and no missed dose the last 4 weeks No side effect He feels well/excellent with his general health  No rash, fever, chill, joint pain, cough, headache, n/v/diarrhea   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 He used to live in [place] and left 2 years ago to live in [place] for work.  -his sister still lives in [place] -he wants to continue coming to our clinic -no smoking/drinking/substance use -works for [employer] -he is no longer married; his previous wife is in [place] -he is not in a relationship. He is sexually active    Patient was last seen by dr Ninetta Lights 11/21/21 -------------- 42 yo M with newly dx 02-2014 HIV+; risk msm. Was born in [place], came to [place] in end of 2015. Also dx, tx for schistosomiasis 2016. On genvoya- no problems with this, rare missed doses.  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   Denies missed doses of meds. Has been doing well. Has gained wt BMI 23.  His mpox lesions have resolved.  Has been working for [employer].  Needs labs today.   HIV 1 RNA Quant  Date Value  12/04/2021 <20 Copies/mL (H)  05/27/2020 <20 Copies/mL (H)  08/28/2019 <20 DETECTED copies/mL (A)   CD4 T Cell Abs (/uL)  Date Value  12/04/2021 646   08/28/2019 524  01/07/2018 530     Health Maintenance  Topic Date Due   COVID-19 Vaccine (4 - 2023-24 season) 10/06/2021   INFLUENZA VACCINE  09/06/2022   DTaP/Tdap/Td (2 - Td or Tdap) 04/07/2031   Hepatitis C Screening  Completed   HIV Screening  Completed   HPV VACCINES  Aged Out     ROS: Please see HPI. All other systems reviewed and negative.     Objective:   Vitals:   06/14/22 1519  BP: 109/79  Pulse: 69  Resp: 16  SpO2: 98%   Physical exam: General/constitutional: no distress, pleasant HEENT: Normocephalic, PER, Conj Clear, EOMI, Oropharynx clear Neck supple CV: rrr no mrg Lungs: clear to auscultation, normal respiratory effort Abd: Soft, Nontender Ext: no edema Skin: No Rash Neuro: nonfocal MSK: no peripheral joint swelling/tenderness/warmth; back spines nontender     Labs: Lab Results  Component Value Date   WBC 3.5 (L) 12/04/2021   HGB 14.2 12/04/2021   HCT 40.7 12/04/2021   MCV 93.8 12/04/2021   PLT 162 12/04/2021   Last metabolic panel Lab Results  Component Value Date   GLUCOSE 94 12/04/2021   NA 140 12/04/2021   K 4.4 12/04/2021   CL 106 12/04/2021   CO2 27 12/04/2021   BUN 17 12/04/2021   CREATININE 1.29 (H) 12/04/2021  EGFR 72 12/04/2021   CALCIUM 9.4 12/04/2021   PROT 6.9 12/04/2021   ALBUMIN 4.0 08/20/2016   BILITOT 0.5 12/04/2021   ALKPHOS 52 08/20/2016   AST 24 12/04/2021   ALT 18 12/04/2021   ANIONGAP 6 07/07/2014   Lab Results  Component Value Date   HIV1RNAQUANT <20 (H) 12/04/2021   Lab Results  Component Value Date   CD4TCELL 33 12/04/2021   CD4TABS 646 12/04/2021   11/2021 gc/chlam urine negative; rpr 1:1 improved       Assessment & Plan:  #hiv Risk -- msm Well controlled on genvoya Vaccinated and reactive protection to hepatitis B  Switched to dovato 12/2021; tolerate and compliant 06/14/22 discussed reprieve trial again today -- patient not interested in statin at this time     -discussed  u=u -encourage compliance -continue current HIV medication -labs today -f/u in 6 months    #hx syphilis Treated early latent 05/2020 and responding Last std screen 11/2021 negative urine gc/chlam  -repeat std testing today 06/14/22    #hcm -vaccination Doesn't flu or covid vaccine -hepatitis  2015 hep b sAb positive; negative hep b sag and cAb 06/14/22 repeat hepatitis c and b serology for screening -tb screening Will send today 06/14/22 -std screening Send triple screen and rpr today 06/14/22 -cancer screening Will do anal pap later this year 2024

## 2022-06-15 LAB — T-HELPER CELLS (CD4) COUNT (NOT AT ARMC)
CD4 % Helper T Cell: 34 % (ref 33–65)
CD4 T Cell Abs: 696 /uL (ref 400–1790)

## 2022-06-15 LAB — HEPATITIS B CORE ANTIBODY, TOTAL: Hep B Core Total Ab: REACTIVE — AB

## 2022-06-15 LAB — HEPATITIS B SURFACE ANTIBODY, QUANTITATIVE: Hep B S AB Quant (Post): >1000 m[IU]/mL (ref >=10)

## 2022-06-16 LAB — QUANTIFERON-TB GOLD PLUS
Mitogen-NIL: >10 IU/mL
NIL: 0.08 IU/mL
QuantiFERON-TB Gold Plus: NEGATIVE
TB1-NIL: 0.01 IU/mL
TB2-NIL: <0 IU/mL

## 2022-06-16 LAB — COMPLETE METABOLIC PANEL WITH GFR
AG Ratio: 1.7 (calc) (ref 1.0–2.5)
ALT: 19 U/L (ref 9–46)
AST: 28 U/L (ref 10–40)
Albumin: 4 g/dL (ref 3.6–5.1)
Alkaline phosphatase (APISO): 49 U/L (ref 36–130)
BUN: 13 mg/dL (ref 7–25)
CO2: 26 mmol/L (ref 20–32)
Calcium: 9 mg/dL (ref 8.6–10.3)
Chloride: 106 mmol/L (ref 98–110)
Creat: 1.17 mg/dL (ref 0.60–1.26)
Globulin: 2.4 g/dL (calc) (ref 1.9–3.7)
Glucose, Bld: 92 mg/dL (ref 65–99)
Potassium: 4 mmol/L (ref 3.5–5.3)
Sodium: 139 mmol/L (ref 135–146)
Total Bilirubin: 0.6 mg/dL (ref 0.2–1.2)
Total Protein: 6.4 g/dL (ref 6.1–8.1)
eGFR: 81 mL/min/{1.73_m2} (ref >=60)

## 2022-06-16 LAB — CBC
HCT: 38 % — ABNORMAL LOW (ref 38.5–50.0)
Hemoglobin: 12.8 g/dL — ABNORMAL LOW (ref 13.2–17.1)
MCH: 32.5 pg (ref 27.0–33.0)
MCHC: 33.7 g/dL (ref 32.0–36.0)
MCV: 96.4 fL (ref 80.0–100.0)
MPV: 11.7 fL (ref 7.5–12.5)
Platelets: 172 10*3/uL (ref 140–400)
RBC: 3.94 10*6/uL — ABNORMAL LOW (ref 4.20–5.80)
RDW: 10.8 % — ABNORMAL LOW (ref 11.0–15.0)
WBC: 4.1 10*3/uL (ref 3.8–10.8)

## 2022-06-16 LAB — HIV-1 RNA QUANT-NO REFLEX-BLD
HIV 1 RNA Quant: <20 Copies/mL — ABNORMAL HIGH
HIV-1 RNA Quant, Log: <1.3 Log cps/mL — ABNORMAL HIGH

## 2022-06-16 LAB — RPR: RPR Ser Ql: REACTIVE — AB

## 2022-06-16 LAB — RPR TITER: RPR Titer: 1:1 {titer} — ABNORMAL HIGH

## 2022-06-16 LAB — HEPATITIS C ANTIBODY: Hepatitis C Ab: NONREACTIVE

## 2022-06-16 LAB — HEPATITIS B SURFACE ANTIGEN: Hepatitis B Surface Ag: NONREACTIVE

## 2022-06-16 LAB — T PALLIDUM AB: T Pallidum Abs: POSITIVE — AB

## 2022-06-18 LAB — CYTOLOGY, (ORAL, ANAL, URETHRAL) ANCILLARY ONLY
Chlamydia: NEGATIVE
Chlamydia: POSITIVE — AB
Comment: NEGATIVE
Comment: NEGATIVE
Comment: NORMAL
Comment: NORMAL
Neisseria Gonorrhea: NEGATIVE
Neisseria Gonorrhea: POSITIVE — AB

## 2022-06-18 LAB — URINE CYTOLOGY ANCILLARY ONLY
Chlamydia: NEGATIVE
Comment: NEGATIVE
Comment: NEGATIVE
Comment: NORMAL
Neisseria Gonorrhea: NEGATIVE
Trichomonas: NEGATIVE

## 2022-06-19 ENCOUNTER — Telehealth: Payer: Self-pay

## 2022-06-19 MED ORDER — DOXYCYCLINE HYCLATE 100 MG PO TABS
100.0000 mg | ORAL_TABLET | Freq: Two times a day (BID) | ORAL | 0 refills | Status: AC
Start: 2022-06-19 — End: 2022-06-26

## 2022-06-19 NOTE — Telephone Encounter (Signed)
-----   Message from Raymondo Band, MD sent at 06/19/2022  9:58 AM EDT ----- Hi team  Please have him back for treatment with ceftriaxone 500 mg IM once and 7 days of doxycycline 100 mg po bid  No need for test of cure  F/u with me as before discussed (6 months)  thanks

## 2022-06-19 NOTE — Addendum Note (Signed)
Addended byRutha Bouchard T on: 06/19/2022 10:25 AM   Modules accepted: Orders

## 2022-06-19 NOTE — Telephone Encounter (Signed)
Patient is aware of results and is scheduled for a nurse visit on 5/21 for ceftriaxone 500 mg  IM once and doxy 100 mg po for 7 days sent to [facility] specialty pharmacy.

## 2022-06-25 ENCOUNTER — Other Ambulatory Visit: Payer: Self-pay

## 2022-06-25 ENCOUNTER — Ambulatory Visit (INDEPENDENT_AMBULATORY_CARE_PROVIDER_SITE_OTHER): Payer: Self-pay

## 2022-06-25 DIAGNOSIS — A749 Chlamydial infection, unspecified: Secondary | ICD-10-CM

## 2022-06-25 MED ORDER — CEFTRIAXONE SODIUM 500 MG IJ SOLR
500.0000 mg | Freq: Once | INTRAMUSCULAR | Status: AC
Start: 2022-06-25 — End: 2022-06-25
  Administered 2022-06-25: 500 mg via INTRAMUSCULAR

## 2022-06-26 ENCOUNTER — Ambulatory Visit: Payer: Self-pay

## 2022-12-11 ENCOUNTER — Encounter: Payer: Self-pay | Admitting: Internal Medicine

## 2022-12-11 ENCOUNTER — Other Ambulatory Visit: Payer: Self-pay

## 2022-12-11 ENCOUNTER — Ambulatory Visit (INDEPENDENT_AMBULATORY_CARE_PROVIDER_SITE_OTHER): Payer: Self-pay | Admitting: Internal Medicine

## 2022-12-11 VITALS — BP 120/66 | HR 56 | Temp 97.7°F | Ht 70.0 in | Wt 158.0 lb

## 2022-12-11 DIAGNOSIS — R85619 Unspecified abnormal cytological findings in specimens from anus: Secondary | ICD-10-CM

## 2022-12-11 DIAGNOSIS — Z79899 Other long term (current) drug therapy: Secondary | ICD-10-CM

## 2022-12-11 DIAGNOSIS — Z1212 Encounter for screening for malignant neoplasm of rectum: Secondary | ICD-10-CM

## 2022-12-11 DIAGNOSIS — B20 Human immunodeficiency virus [HIV] disease: Secondary | ICD-10-CM

## 2022-12-11 DIAGNOSIS — Z113 Encounter for screening for infections with a predominantly sexual mode of transmission: Secondary | ICD-10-CM

## 2022-12-11 NOTE — Patient Instructions (Signed)
You can do every 3 months std screen in [place]    Come to see [place] every 6 months for hiv care otherwise

## 2022-12-11 NOTE — Progress Notes (Addendum)
Subjective:    Patient ID: Sean Dodson, male  DOB: 09/25/1980, 42 y.o.        MRN: [id number]   HPI Patient is here for continued hiv care  12/11/22 id clinic See a&P  06/14/22 id clinic Switched genvoya to dovato 12/2021; tolerating; no missed dose last 4 weeks Due for labs today He is sexually active -- and wants to do std testing He never had anal pap smear before No concern today though  He continues to get here from [facility] for hiv care   12/2021 id clinic  He has no health concern today Reviewed hiv testing from 11/2021 and he remains well controlled He takes genvoya and no missed dose the last 4 weeks No side effect He feels well/excellent with his general health  No rash, fever, chill, joint pain, cough, headache, n/v/diarrhea   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 He used to live in [place] and left 2 years ago to live in [place] for work.  -his sister still lives in [place] -he wants to continue coming to our clinic -no smoking/drinking/substance use -works for [employer] -he is no longer married; his previous wife is in [place] -he is not in a relationship. He is sexually active    Patient was last seen by dr Sean Dodson 11/21/21 -------------- 42 yo M with newly dx 02-2014 HIV+; risk msm. Was born in [place], came to [place] in end of 2015. Also dx, tx for schistosomiasis 2016. On genvoya- no problems with this, rare missed doses.  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   Denies missed doses of meds. Has been doing well. Has gained wt BMI 23.  His mpox lesions have resolved.  Has been working for [employer].  Needs labs today.   HIV 1 RNA Quant (Copies/mL)  Date Value  06/14/2022 <20 (H)  12/04/2021 <20 (H)  05/27/2020 <20 (H)   CD4 T Cell Abs (/uL)  Date Value  06/14/2022 696   12/04/2021 646  08/28/2019 524     Health Maintenance  Topic Date Due   INFLUENZA VACCINE  09/06/2022   COVID-19 Vaccine (4 - 2023-24 season) 10/07/2022   DTaP/Tdap/Td (2 - Td or Tdap) 04/07/2031   Hepatitis C Screening  Completed   HIV Screening  Completed   HPV VACCINES  Aged Out     ROS: Please see HPI. All other systems reviewed and negative.     Objective:   Vitals:   12/11/22 1527  BP: 120/66  Pulse: (!) 56  Temp: 97.7 F (36.5 C)   Physical exam: General/constitutional: no distress, pleasant HEENT: Normocephalic, PER, Conj Clear, EOMI, Oropharynx clear Neck supple CV: rrr no mrg Lungs: clear to auscultation, normal respiratory effort Abd: Soft, Nontender Ext: no edema Skin: No Rash Neuro: nonfocal MSK: no peripheral joint swelling/tenderness/warmth; back spines nontender     Labs: Lab Results  Component Value Date   WBC 4.1 06/14/2022   HGB 12.8 (L) 06/14/2022   HCT 38.0 (L) 06/14/2022   MCV 96.4 06/14/2022   PLT 172 06/14/2022   Last metabolic panel Lab Results  Component Value Date   GLUCOSE 92 06/14/2022   NA 139 06/14/2022   K 4.0 06/14/2022   CL 106 06/14/2022   CO2 26 06/14/2022   BUN 13 06/14/2022  CREATININE 1.17 06/14/2022   EGFR 81 06/14/2022   CALCIUM 9.0 06/14/2022   PROT 6.4 06/14/2022   ALBUMIN 4.0 08/20/2016   BILITOT 0.6 06/14/2022   ALKPHOS 52 08/20/2016   AST 28 06/14/2022   ALT 19 06/14/2022   ANIONGAP 6 07/07/2014   Lab Results  Component Value Date   HIV1RNAQUANT <20 (H) 06/14/2022   Lab Results  Component Value Date   CD4TCELL 34 06/14/2022   CD4TABS 696 06/14/2022          Assessment & Plan:  #hiv Risk -- msm Well controlled on genvoya Vaccinated and also prior infection/resolved to hepatitis B  Switched to dovato 12/2021; tolerate and compliant 06/14/22 discussed reprieve trial again today -- patient not interested in statin at this time  12/11/22 previous labs showed well controlled hiv on  dovato   -discussed u=u -encourage compliance -continue current HIV medication -labs 6 months during f/u    #hx syphilis #chlamydia/neisseria  Treated early latent 05/2020 and responding 06/2022 std screen positive for chlam/gonorrhea (treated)  -repeat std testing today -he can have every 3 months std screen in [place] and come here every 6 months for hiv testing/monitoring     #hcm -vaccination Doesn't flu or covid vaccine -hepatitis  06/2022 hep b testing showed resolved hep b infection (core ab positive; sAb >1000) 06/14/22 hep c ab negative -tb screening 06/2022 quantiferon gold egative -std screening 06/2022 chlamydia and gonorrhea positive -cancer screening Anal pap 12/2022  ----------------- 12/18/22 addendum Anal cancer screening hpv high risk and ascus -- referral to dr Sean Dodson Sean Dodson for hra placed

## 2022-12-12 LAB — CYTOLOGY, (ORAL, ANAL, URETHRAL) ANCILLARY ONLY
Chlamydia: NEGATIVE
Chlamydia: POSITIVE — AB
Comment: NEGATIVE
Comment: NEGATIVE
Comment: NORMAL
Comment: NORMAL
Neisseria Gonorrhea: NEGATIVE
Neisseria Gonorrhea: POSITIVE — AB

## 2022-12-12 LAB — URINE CYTOLOGY ANCILLARY ONLY
Chlamydia: NEGATIVE
Comment: NEGATIVE
Comment: NORMAL
Neisseria Gonorrhea: NEGATIVE

## 2022-12-12 LAB — T-HELPER CELL (CD4) - (RCID CLINIC ONLY)
CD4 % Helper T Cell: 32 % — ABNORMAL LOW (ref 33–65)
CD4 T Cell Abs: 654 /uL (ref 400–1790)

## 2022-12-13 ENCOUNTER — Telehealth: Payer: Self-pay

## 2022-12-13 DIAGNOSIS — A749 Chlamydial infection, unspecified: Secondary | ICD-10-CM

## 2022-12-13 LAB — CBC WITH DIFFERENTIAL/PLATELET
Absolute Lymphocytes: 2364 {cells}/uL (ref 850–3900)
Absolute Monocytes: 340 {cells}/uL (ref 200–950)
Basophils Absolute: 8 {cells}/uL (ref 0–200)
Basophils Relative: 0.2 %
Eosinophils Absolute: 40 {cells}/uL (ref 15–500)
Eosinophils Relative: 1 %
HCT: 39 % (ref 38.5–50.0)
Hemoglobin: 12.9 g/dL — ABNORMAL LOW (ref 13.2–17.1)
MCH: 33 pg (ref 27.0–33.0)
MCHC: 33.1 g/dL (ref 32.0–36.0)
MCV: 99.7 fL (ref 80.0–100.0)
MPV: 12.5 fL (ref 7.5–12.5)
Monocytes Relative: 8.5 %
Neutro Abs: 1248 {cells}/uL — ABNORMAL LOW (ref 1500–7800)
Neutrophils Relative %: 31.2 %
Platelets: 166 10*3/uL (ref 140–400)
RBC: 3.91 10*6/uL — ABNORMAL LOW (ref 4.20–5.80)
RDW: 10.6 % — ABNORMAL LOW (ref 11.0–15.0)
Total Lymphocyte: 59.1 %
WBC: 4 10*3/uL (ref 3.8–10.8)

## 2022-12-13 LAB — COMPLETE METABOLIC PANEL WITH GFR
AG Ratio: 1.5 (calc) (ref 1.0–2.5)
ALT: 17 U/L (ref 9–46)
AST: 22 U/L (ref 10–40)
Albumin: 4 g/dL (ref 3.6–5.1)
Alkaline phosphatase (APISO): 57 U/L (ref 36–130)
BUN: 10 mg/dL (ref 7–25)
CO2: 30 mmol/L (ref 20–32)
Calcium: 9.5 mg/dL (ref 8.6–10.3)
Chloride: 104 mmol/L (ref 98–110)
Creat: 1.12 mg/dL (ref 0.60–1.29)
Globulin: 2.7 g/dL (ref 1.9–3.7)
Glucose, Bld: 96 mg/dL (ref 65–99)
Potassium: 4.4 mmol/L (ref 3.5–5.3)
Sodium: 139 mmol/L (ref 135–146)
Total Bilirubin: 0.3 mg/dL (ref 0.2–1.2)
Total Protein: 6.7 g/dL (ref 6.1–8.1)
eGFR: 85 mL/min/{1.73_m2} (ref >=60)

## 2022-12-13 LAB — LIPID PANEL
Cholesterol: 175 mg/dL (ref <200)
HDL: 58 mg/dL (ref >=40)
LDL Cholesterol (Calc): 94 mg/dL
Non-HDL Cholesterol (Calc): 117 mg/dL (ref <130)
Total CHOL/HDL Ratio: 3 (calc) (ref <5.0)
Triglycerides: 124 mg/dL (ref <150)

## 2022-12-13 LAB — RPR TITER: RPR Titer: 1:1 {titer} — ABNORMAL HIGH

## 2022-12-13 LAB — RPR: RPR Ser Ql: REACTIVE — AB

## 2022-12-13 LAB — HIV-1 RNA QUANT-NO REFLEX-BLD
HIV 1 RNA Quant: <20 {copies}/mL — ABNORMAL HIGH
HIV-1 RNA Quant, Log: <1.3 {Log_copies}/mL — ABNORMAL HIGH

## 2022-12-13 LAB — T PALLIDUM AB: T Pallidum Abs: POSITIVE — AB

## 2022-12-13 MED ORDER — DOXYCYCLINE HYCLATE 100 MG PO TABS: 100.0000 mg | ORAL_TABLET | Freq: Two times a day (BID) | ORAL | 0 refills | Status: AC

## 2022-12-13 NOTE — Telephone Encounter (Signed)
Called patient to discuss treatment options since he lives in [place], no answer. Interpreter left message requesting call back.   Sandie Ano, RN

## 2022-12-13 NOTE — Addendum Note (Signed)
Addended by: Juanita Laster on: 12/13/2022 02:52 PM   Modules accepted: Orders

## 2022-12-13 NOTE — Telephone Encounter (Signed)
Spoke with patient regarding test results. Would prefer to have Gonorrhea treatment done locally. Understands he needs to [facility]. Understands that doxy prescription will be sent to pharmacy.  Informed him that he will need to inform recent partners to get tested/ treated at local HD. Advised to refrain from sexual activity until he is finished with gonorrhea and chlamydia treatment and wait additional ten days. Verbalized understanding.  Will call office once treated for gonorrhea and schedule follow up in three months for retesting. Juanita Laster, RMA

## 2022-12-13 NOTE — Telephone Encounter (Signed)
Patient's rectal swab positive for chlamydia and gonorrhea. Routing to provider for orders.   Sandie Ano, RN

## 2022-12-18 ENCOUNTER — Telehealth: Payer: Self-pay

## 2022-12-18 LAB — CYTOLOGY - PAP
Adequacy: ABSENT
Comment: NEGATIVE
Diagnosis: UNDETERMINED — AB
High risk HPV: POSITIVE — AB

## 2022-12-18 NOTE — Telephone Encounter (Signed)
Spoke with Sean Dodson and discussed anal pap results and need for further evaluation. Notified him I would send a MyChart message with additional info. Patient verbalized understanding and has no further questions.    Sandie Ano, RN

## 2022-12-18 NOTE — Addendum Note (Signed)
Addended by: Rutha Bouchard T on: 12/18/2022 04:32 PM   Modules accepted: Orders

## 2022-12-18 NOTE — Telephone Encounter (Signed)
-----   Message from Raymondo Band sent at 12/18/2022  4:33 PM EST ----- Hi team, I have placed a colorectal surgery referral for his positive anal pap smear  Claris Che could you please set him up and let him know.  Thank you!

## 2022-12-31 ENCOUNTER — Other Ambulatory Visit: Payer: Self-pay | Admitting: Internal Medicine

## 2023-06-19 NOTE — Progress Notes (Signed)
 The 10-year ASCVD risk score (Arnett DK, et al., 2019) is: 2.5%   Values used to calculate the score:     Age: 43 years     Sex: Male     Is Non-Hispanic African American: Yes     Diabetic: No     Tobacco smoker: No     Systolic Blood Pressure: 120 mmHg     Is BP treated: No     HDL Cholesterol: 58 mg/dL     Total Cholesterol: 175 mg/dL  Arlon Bergamo, BSN, RN

## 2024-01-04 ENCOUNTER — Other Ambulatory Visit: Payer: Self-pay | Admitting: Internal Medicine

## 2024-02-02 ENCOUNTER — Other Ambulatory Visit: Payer: Self-pay | Admitting: Internal Medicine

## 2024-02-03 NOTE — Telephone Encounter (Signed)
"  Due for appt  "

## 2024-02-10 ENCOUNTER — Other Ambulatory Visit: Payer: Self-pay | Admitting: Internal Medicine

## 2024-02-21 ENCOUNTER — Ambulatory Visit: Payer: Self-pay | Admitting: Infectious Diseases

## 2024-03-02 ENCOUNTER — Ambulatory Visit: Payer: Self-pay | Admitting: Family

## 2024-03-09 ENCOUNTER — Ambulatory Visit: Payer: Self-pay | Admitting: Internal Medicine

## 2024-03-11 ENCOUNTER — Other Ambulatory Visit: Payer: Self-pay | Admitting: Internal Medicine

## 2024-03-11 NOTE — Telephone Encounter (Signed)
Appt 2/9

## 2024-03-16 ENCOUNTER — Ambulatory Visit: Payer: Self-pay | Admitting: Internal Medicine
